# Patient Record
Sex: Female | Born: 1995 | Hispanic: Yes | Marital: Single | Smoking: Former smoker
Health system: Southern US, Community
[De-identification: ages and names within clinical notes are randomized; demographics above are authoritative.]

## PROBLEM LIST (undated history)

## (undated) DIAGNOSIS — J069 Acute upper respiratory infection, unspecified: Secondary | ICD-10-CM

## (undated) HISTORY — DX: Acute upper respiratory infection, unspecified: J06.9

---

## 2017-04-08 ENCOUNTER — Emergency Department (HOSPITAL_COMMUNITY)
Admission: EM | Admit: 2017-04-08 | Discharge: 2017-04-08 | Disposition: A | Discharge status: Home / Self Care | Payer: Self-pay | Attending: Emergency Medicine | Admitting: Emergency Medicine

## 2017-04-08 ENCOUNTER — Encounter (HOSPITAL_COMMUNITY): Payer: Self-pay | Admitting: Emergency Medicine

## 2017-04-08 DIAGNOSIS — K529 Noninfective gastroenteritis and colitis, unspecified: Secondary | ICD-10-CM | POA: Insufficient documentation

## 2017-04-08 DIAGNOSIS — F17200 Nicotine dependence, unspecified, uncomplicated: Secondary | ICD-10-CM | POA: Insufficient documentation

## 2017-04-08 LAB — COMPREHENSIVE METABOLIC PANEL
ALK PHOS: 37 U/L — AB (ref 38–126)
ALT: 13 U/L — ABNORMAL LOW (ref 14–54)
ANION GAP: 7 (ref 5–15)
AST: 21 U/L (ref 15–41)
Albumin: 4 g/dL (ref 3.5–5.0)
BUN: 8 mg/dL (ref 6–20)
CALCIUM: 9.1 mg/dL (ref 8.9–10.3)
CO2: 29 mmol/L (ref 22–32)
CREATININE: 1.01 mg/dL — AB (ref 0.44–1.00)
Chloride: 105 mmol/L (ref 101–111)
GFR calc non Af Amer: >60 mL/min (ref >60)
GLUCOSE: 130 mg/dL — AB (ref 65–99)
Potassium: 3.9 mmol/L (ref 3.5–5.1)
Sodium: 141 mmol/L (ref 135–145)
TOTAL PROTEIN: 6.7 g/dL (ref 6.5–8.1)
Total Bilirubin: 0.9 mg/dL (ref 0.3–1.2)

## 2017-04-08 LAB — URINALYSIS, ROUTINE W REFLEX MICROSCOPIC
BILIRUBIN URINE: NEGATIVE
Glucose, UA: NEGATIVE mg/dL
HGB URINE DIPSTICK: NEGATIVE
KETONES UR: NEGATIVE mg/dL
Leukocytes, UA: NEGATIVE
NITRITE: NEGATIVE
PROTEIN: NEGATIVE mg/dL
SPECIFIC GRAVITY, URINE: 1.009 (ref 1.005–1.030)
pH: 7 (ref 5.0–8.0)

## 2017-04-08 LAB — CBC
HCT: 37.6 % (ref 36.0–46.0)
HEMOGLOBIN: 13.1 g/dL (ref 12.0–15.0)
MCH: 31 pg (ref 26.0–34.0)
MCHC: 34.8 g/dL (ref 30.0–36.0)
MCV: 88.9 fL (ref 78.0–100.0)
PLATELETS: 190 10*3/uL (ref 150–400)
RBC: 4.23 MIL/uL (ref 3.87–5.11)
RDW: 12.7 % (ref 11.5–15.5)
WBC: 3.2 10*3/uL — ABNORMAL LOW (ref 4.0–10.5)

## 2017-04-08 LAB — POC URINE PREG, ED: PREG TEST UR: NEGATIVE

## 2017-04-08 LAB — LIPASE, BLOOD: LIPASE: 29 U/L (ref 11–51)

## 2017-04-08 MED ORDER — ONDANSETRON 4 MG PO TBDP
4.0000 mg | ORAL_TABLET | Freq: Once | ORAL | Status: AC | PRN
  Administered 2017-04-08: 4 mg via ORAL
  Filled 2017-04-08: qty 1

## 2017-04-08 MED ORDER — ONDANSETRON 4 MG PO TBDP: 4.0000 mg | ORAL_TABLET | Freq: Three times a day (TID) | ORAL | 0 refills | Status: DC | PRN

## 2017-04-08 MED ORDER — DIPHENOXYLATE-ATROPINE 2.5-0.025 MG PO TABS
2.0000 | ORAL_TABLET | Freq: Once | ORAL | Status: AC
  Administered 2017-04-08: 2 via ORAL
  Filled 2017-04-08: qty 2

## 2017-04-08 MED ORDER — DIPHENOXYLATE-ATROPINE 2.5-0.025 MG PO TABS: 1.0000 | ORAL_TABLET | Freq: Four times a day (QID) | ORAL | 0 refills | Status: DC | PRN

## 2017-04-08 NOTE — Discharge Instructions (Signed)
Clear liquids, push fluids.  Lomotil for diarrhea.  Zofran for nausea.

## 2017-04-08 NOTE — ED Notes (Signed)
Bed: [facility]PT4 Expected date:  Expected time:  Means of arrival:  Comments: 

## 2017-04-08 NOTE — ED Triage Notes (Signed)
Pt reports n/v/d since Friday. Has nausea today, but no emesis. Has kept fluids down today.

## 2017-04-08 NOTE — ED Provider Notes (Addendum)
[facility]-EMERGENCY DEPT Provider Note   CSN: [id number] Arrival date & time: 04/08/17  1539     History   Chief Complaint Chief Complaint  Patient presents with  . Diarrhea  . Nausea    HPI Claire Russell is a 21 y.o. female. Chief complaint is vomiting and diarrhea.  HPI 21 year old female. Today is Sunday. On Thursday night she ate at [employer]. She had diarrhea and vomiting for the last 2 days. Diarrhea has slowed but persists today. No blood pus or mucus in her stools. Last emesis was last night. Heme-negative nonbilious emesis. No abdominal pain. No fatigue. No lightheadedness or presyncope. Denies pregnancy. No urinary symptoms.  History reviewed. No pertinent past medical history.  There are no active problems to display for this patient.   History reviewed. No pertinent surgical history.  OB History    No data available       Home Medications    Prior to Admission medications   Medication Sig Start Date End Date Taking? Authorizing Provider  diphenoxylate-atropine (LOMOTIL) 2.5-0.025 MG tablet Take 1 tablet by mouth 4 (four) times daily as needed for diarrhea or loose stools. 04/08/17   Rolland Porter, MD  ondansetron (ZOFRAN ODT) 4 MG disintegrating tablet Take 1 tablet (4 mg total) by mouth every 8 (eight) hours as needed for nausea. 04/08/17   Rolland Porter, MD    Family History History reviewed. No pertinent family history.  Social History Social History  Substance Use Topics  . Smoking status: Current Every Day Smoker  . Smokeless tobacco: Never Used  . Alcohol use Yes     Allergies   Patient has no known allergies.   Review of Systems Review of Systems  Constitutional: Negative for appetite change, chills, diaphoresis, fatigue and fever.  HENT: Negative for mouth sores, sore throat and trouble swallowing.   Eyes: Negative for visual disturbance.  Respiratory: Negative for cough, chest tightness, shortness of breath and  wheezing.   Cardiovascular: Negative for chest pain.  Gastrointestinal: Positive for diarrhea, nausea and vomiting. Negative for abdominal distention and abdominal pain.  Endocrine: Negative for polydipsia, polyphagia and polyuria.  Genitourinary: Negative for dysuria, frequency and hematuria.  Musculoskeletal: Negative for gait problem.  Skin: Negative for color change, pallor and rash.  Neurological: Negative for dizziness, syncope, light-headedness and headaches.  Hematological: Does not bruise/bleed easily.  Psychiatric/Behavioral: Negative for behavioral problems and confusion.     Physical Exam Updated Vital Signs BP 133/88   Pulse 68   Temp 98.1 F (36.7 C) (Oral)   Resp 16   SpO2 100%   Physical Exam  Constitutional: She is oriented to person, place, and time. She appears well-developed and well-nourished. No distress.  HENT:  Head: Normocephalic.  Eyes: Conjunctivae are normal. Pupils are equal, round, and reactive to light. No scleral icterus.  Neck: Normal range of motion. Neck supple. No thyromegaly present.  Cardiovascular: Normal rate and regular rhythm. Exam reveals no gallop and no friction rub.  No murmur heard. Pulmonary/Chest: Effort normal and breath sounds normal. No respiratory distress. She has no wheezes. She has no rales.  Abdominal: Soft. Bowel sounds are normal. She exhibits no distension. There is no tenderness. There is no rebound.  Musculoskeletal: Normal range of motion.  Neurological: She is alert and oriented to person, place, and time.  Skin: Skin is warm and dry. No rash noted.  Psychiatric: She has a normal mood and affect. Her behavior is normal.     ED  Treatments / Results  Labs (all labs ordered are listed, but only abnormal results are displayed) Labs Reviewed  COMPREHENSIVE METABOLIC PANEL - Abnormal; Notable for the following:       Result Value   Glucose, Bld 130 (*)    Creatinine, Ser 1.01 (*)    ALT 13 (*)    Alkaline  Phosphatase 37 (*)    All other components within normal limits  CBC - Abnormal; Notable for the following:    WBC 3.2 (*)    All other components within normal limits  URINALYSIS, ROUTINE W REFLEX MICROSCOPIC - Abnormal; Notable for the following:    Color, Urine STRAW (*)    All other components within normal limits  LIPASE, BLOOD  POC URINE PREG, ED    EKG  EKG Interpretation None       Radiology No results found.  Procedures Procedures (including critical care time)  Medications Ordered in ED Medications  diphenoxylate-atropine (LOMOTIL) 2.5-0.025 MG per tablet 2 tablet (not administered)  ondansetron (ZOFRAN-ODT) disintegrating tablet 4 mg (4 mg Oral Given 04/08/17 1601)     Initial Impression / Assessment and Plan / ED Course  I have reviewed the triage vital signs and the nursing notes.  Pertinent labs & imaging results that were available during my care of the patient were reviewed by me and considered in my medical decision making (see chart for details).     Not pregnant. Reassuring labs. Does not appear dehydrated. Plan Lomotil, Zofran, fluids. Work note.  Final Clinical Impressions(s) / ED Diagnoses   Final diagnoses:  Gastroenteritis    New Prescriptions New Prescriptions   DIPHENOXYLATE-ATROPINE (LOMOTIL) 2.5-0.025 MG TABLET    Take 1 tablet by mouth 4 (four) times daily as needed for diarrhea or loose stools.   ONDANSETRON (ZOFRAN ODT) 4 MG DISINTEGRATING TABLET    Take 1 tablet (4 mg total) by mouth every 8 (eight) hours as needed for nausea.     Rolland PorterJames, Daja Shuping, MD 04/08/17 [id number]    Rolland PorterJames, Delaine Canter, MD 04/30/17 [id number]    Rolland PorterJames, Zylen Wenig, MD 06/21/17 1440

## 2017-05-25 ENCOUNTER — Encounter (HOSPITAL_COMMUNITY): Payer: Self-pay

## 2017-05-25 ENCOUNTER — Emergency Department (HOSPITAL_COMMUNITY)
Admission: EM | Admit: 2017-05-25 | Discharge: 2017-05-25 | Disposition: A | Discharge status: Home / Self Care | Payer: Self-pay | Attending: Emergency Medicine | Admitting: Emergency Medicine

## 2017-05-25 ENCOUNTER — Other Ambulatory Visit: Payer: Self-pay

## 2017-05-25 DIAGNOSIS — J029 Acute pharyngitis, unspecified: Secondary | ICD-10-CM | POA: Insufficient documentation

## 2017-05-25 DIAGNOSIS — F17210 Nicotine dependence, cigarettes, uncomplicated: Secondary | ICD-10-CM | POA: Insufficient documentation

## 2017-05-25 LAB — I-STAT BETA HCG BLOOD, ED (MC, WL, AP ONLY): I-stat hCG, quantitative: <5 m[IU]/mL (ref <5)

## 2017-05-25 LAB — URINALYSIS, ROUTINE W REFLEX MICROSCOPIC
BILIRUBIN URINE: NEGATIVE
Glucose, UA: NEGATIVE mg/dL
KETONES UR: NEGATIVE mg/dL
LEUKOCYTES UA: NEGATIVE
NITRITE: NEGATIVE
PROTEIN: 30 mg/dL — AB
Specific Gravity, Urine: 1.013 (ref 1.005–1.030)
pH: 6 (ref 5.0–8.0)

## 2017-05-25 LAB — COMPREHENSIVE METABOLIC PANEL
ALT: 22 U/L (ref 14–54)
ANION GAP: 9 (ref 5–15)
AST: 24 U/L (ref 15–41)
Albumin: 4.3 g/dL (ref 3.5–5.0)
Alkaline Phosphatase: 48 U/L (ref 38–126)
BUN: 7 mg/dL (ref 6–20)
CHLORIDE: 102 mmol/L (ref 101–111)
CO2: 27 mmol/L (ref 22–32)
Calcium: 9 mg/dL (ref 8.9–10.3)
Creatinine, Ser: 0.9 mg/dL (ref 0.44–1.00)
GFR calc Af Amer: >60 mL/min (ref >60)
Glucose, Bld: 94 mg/dL (ref 65–99)
POTASSIUM: 3.9 mmol/L (ref 3.5–5.1)
Sodium: 138 mmol/L (ref 135–145)
Total Bilirubin: 0.7 mg/dL (ref 0.3–1.2)
Total Protein: 6.9 g/dL (ref 6.5–8.1)

## 2017-05-25 LAB — CBC
HEMATOCRIT: 39.1 % (ref 36.0–46.0)
HEMOGLOBIN: 13.9 g/dL (ref 12.0–15.0)
MCH: 31.2 pg (ref 26.0–34.0)
MCHC: 35.5 g/dL (ref 30.0–36.0)
MCV: 87.9 fL (ref 78.0–100.0)
Platelets: 228 10*3/uL (ref 150–400)
RBC: 4.45 MIL/uL (ref 3.87–5.11)
RDW: 12.2 % (ref 11.5–15.5)
WBC: 5.2 10*3/uL (ref 4.0–10.5)

## 2017-05-25 LAB — RAPID STREP SCREEN (MED CTR MEBANE ONLY): STREPTOCOCCUS, GROUP A SCREEN (DIRECT): NEGATIVE

## 2017-05-25 LAB — LIPASE, BLOOD: LIPASE: 27 U/L (ref 11–51)

## 2017-05-25 MED ORDER — IBUPROFEN 400 MG PO TABS
400.0000 mg | ORAL_TABLET | Freq: Once | ORAL | Status: AC | PRN
  Administered 2017-05-25: 400 mg via ORAL
  Filled 2017-05-25: qty 1

## 2017-05-25 MED ORDER — LIDOCAINE VISCOUS 2 % MT SOLN
15.0000 mL | Freq: Once | OROMUCOSAL | Status: AC
  Administered 2017-05-25: 15 mL via OROMUCOSAL
  Filled 2017-05-25: qty 15

## 2017-05-25 MED ORDER — LIDOCAINE VISCOUS 2 % MT SOLN: 20.0000 mL | OROMUCOSAL | 1 refills | Status: DC | PRN

## 2017-05-25 MED ORDER — KETOROLAC TROMETHAMINE 15 MG/ML IJ SOLN
15.0000 mg | Freq: Once | INTRAMUSCULAR | Status: AC
  Administered 2017-05-25: 15 mg via INTRAMUSCULAR
  Filled 2017-05-25: qty 1

## 2017-05-25 MED ORDER — DEXAMETHASONE SODIUM PHOSPHATE 10 MG/ML IJ SOLN
10.0000 mg | Freq: Once | INTRAMUSCULAR | Status: AC
  Administered 2017-05-25: 10 mg via INTRAMUSCULAR
  Filled 2017-05-25: qty 1

## 2017-05-25 NOTE — ED Notes (Signed)
No Signature pad available

## 2017-05-25 NOTE — ED Triage Notes (Signed)
Per PT, Pt is coming from work with complaints of sore throat, nausea, vomiting, and diarrhea since Tuesday. Complains of swollen lymph nodes.

## 2017-05-25 NOTE — ED Provider Notes (Signed)
[facility] Provider Note   CSN: [id number] Arrival date & time: 05/25/17  [id number]  History   Chief Complaint Chief Complaint  Patient presents with  . Sore Throat  . Emesis   HPI Claire Russell is a 21 y.o. female who presented to the ED with a sore throat, N/V, and diarrhea since 12/4. She states that she believes she has strep throat. She was around some friends on 12/3 who had similar symptoms. Her throat pain has limited her PO intake and she feels she is getting dehydrate. She take ibuprofen with relief of her symptoms but then they return. She has taken some of her friends antibiotics for 1 day. Her symptoms have required her to leave work for the past 2 days and prompted her to seek further evaluation. She denies cough, fever/chills, choking sensation, hematemesis, bloody stools, SOB, CP.   History reviewed. No pertinent past medical history.  There are no active problems to display for this patient.  History reviewed. No pertinent surgical history.  OB History    No data available     Home Medications    Prior to Admission medications   Medication Sig Start Date End Date Taking? Authorizing Provider  diphenoxylate-atropine (LOMOTIL) 2.5-0.025 MG tablet Take 1 tablet by mouth 4 (four) times daily as needed for diarrhea or loose stools. 04/08/17   Rolland PorterJames, Mark, MD  lidocaine (XYLOCAINE) 2 % solution Use as directed 20 mLs in the mouth or throat as needed for mouth pain. 05/25/17   Zacharey Jensen, Jill AlexandersJustin, MD  ondansetron (ZOFRAN ODT) 4 MG disintegrating tablet Take 1 tablet (4 mg total) by mouth every 8 (eight) hours as needed for nausea. 04/08/17   Rolland PorterJames, Mark, MD   Family History No family history on file.  Social History Social History   Tobacco Use  . Smoking status: Current Every Day Smoker    Packs/day: 0.50    Types: Cigarettes  . Smokeless tobacco: Never Used  Substance Use Topics  . Alcohol use: Yes  . Drug use: No    Allergies   Patient has no known allergies.  Review of Systems  All systems reviewed and are negative for acute change except as noted in the HPI.  Physical Exam Updated Vital Signs BP (!) 140/100 (BP Location: Right Arm)   Pulse 69   Temp 97.9 F (36.6 C) (Oral)   Resp 14   Ht 5\' 3"  (1.6 m)   Wt 68 kg (150 lb)   LMP 05/18/2017   SpO2 100%   BMI 26.57 kg/m   General: Thin female in no acute distress HENT: Palpable submandibular LAD with no anterior of posterior auricular LAD. Oropharynx is erythematous but without exudates. Moist mucus membranes. Conjunctivitis bilaterally without purulence. Uluva midline without deviation   Pulm: Good air movement without wheezing or crackles. No stridor  CV: RRR, no murmurs, no rubs Abdomen: Active bowel sounds, soft, non-distended, no tenderness to palpation  Extremities: No LE edema, pulses palpable  Skin: Warm and dry. No rashes noted Neuro: Alert and oriented x3  ED Treatments / Results  Labs (all labs ordered are listed, but only abnormal results are displayed) Labs Reviewed  URINALYSIS, ROUTINE W REFLEX MICROSCOPIC - Abnormal; Notable for the following components:      Result Value   APPearance HAZY (*)    Hgb urine dipstick SMALL (*)    Protein, ur 30 (*)    Bacteria, UA MANY (*)    Squamous Epithelial / LPF 0-5 (*)  All other components within normal limits  RAPID STREP SCREEN (NOT AT [facility])  CULTURE, GROUP A STREP ([facility])  LIPASE, BLOOD  COMPREHENSIVE METABOLIC PANEL  CBC  I-STAT BETA HCG BLOOD, ED ([facility], [facility], [facility] ONLY)   EKG  EKG Interpretation None      Radiology No results found.  Procedures Procedures (including critical care time)  Medications Ordered in ED Medications  ibuprofen (ADVIL,MOTRIN) tablet 400 mg (400 mg Oral Given 05/25/17 0914)  dexamethasone (DECADRON) injection 10 mg (10 mg Intramuscular Given 05/25/17 1239)  ketorolac (TORADOL) 15 MG/ML injection 15 mg (15 mg Intramuscular Given 05/25/17  1240)  lidocaine (XYLOCAINE) 2 % viscous mouth solution 15 mL (15 mLs Mouth/Throat Given 05/25/17 1240)   Initial Impression / Assessment and Plan / ED Course  I have reviewed the triage vital signs and the nursing notes.  Pertinent labs & imaging results that were available during my care of the patient were reviewed by me and considered in my medical decision making (see chart for details).    Patient presented with sore throat, N/V, and diarrhea of 3 days duration consistent with viral pharyngitis. Her rapid strep test was negative. She likely is suffering from viral pharyngitis. She does not have any findings concerning for mono, peritonsillar abscess, Ludwig's angina, scarlet fever, or Lemierre's syndrome. She was treated acutely with decadron, Toradol, and viscous lidocaine. She experienced significant improvement in her symptoms.   She was encouraged to establish with a PCP and given a prescription for viscous lidocaine and standard return precautions. Patient voiced understanding and agreed with the treatment plan. She was discharged home in stable condition.   Final Clinical Impressions(s) / ED Diagnoses   Final diagnoses:  Sore throat   ED Discharge Orders        Ordered    lidocaine (XYLOCAINE) 2 % solution  As needed     05/25/17 1309       Levora DredgeHelberg, Tea Collums, MD 05/25/17 1319    Gerhard MunchLockwood, Robert, MD 05/27/17 [phone]

## 2017-05-27 LAB — CULTURE, GROUP A STREP (THRC)

## 2018-12-11 ENCOUNTER — Ambulatory Visit (HOSPITAL_COMMUNITY)
Admission: EM | Admit: 2018-12-11 | Discharge: 2018-12-11 | Disposition: A | Discharge status: Home / Self Care | Payer: Self-pay | Attending: Family Medicine | Admitting: Family Medicine

## 2018-12-11 ENCOUNTER — Encounter (HOSPITAL_COMMUNITY): Payer: Self-pay | Admitting: Emergency Medicine

## 2018-12-11 DIAGNOSIS — H60331 Swimmer's ear, right ear: Secondary | ICD-10-CM

## 2018-12-11 MED ORDER — NEOMYCIN-POLYMYXIN-HC 3.5-10000-1 OT SUSP: 4.0000 [drp] | Freq: Three times a day (TID) | OTIC | 0 refills | Status: DC

## 2018-12-11 NOTE — ED Triage Notes (Signed)
Pt states she has ear ache in her right ear. X 2 days

## 2018-12-12 NOTE — ED Provider Notes (Signed)
[facility]   [id number] 12/11/18 Arrival Time: 1723  ASSESSMENT & PLAN:  1. Acute swimmer's ear of right side    Meds ordered this encounter  Medications  . neomycin-polymyxin-hydrocortisone (CORTISPORIN) 3.5-10000-1 OTIC suspension    Sig: Place 4 drops into the right ear 3 (three) times daily.    Dispense:  10 mL    Refill:  0   See AVS for written guidance. Discussed typical duration of symptoms. OTC symptom care as needed. Ensure adequate fluid intake and rest. May f/u with PCP or here as needed.  Reviewed expectations re: course of current medical issues. Questions answered. Outlined signs and symptoms indicating need for more acute intervention. Patient verbalized understanding. After Visit Summary given.   SUBJECTIVE: History from: patient.  Claire BroomsKennedy Stemler is a 23 y.o. female who presents with complaint of right otalgia; without drainage; without bleeding. Onset gradual, 2-3 d ago. Recent cold symptoms: none. Fever: no. Overall normal PO intake without n/v. Sick contacts: no. OTC treatment: none reported. No significant hearing changes. No FB inserted into ears.  Social History   Tobacco Use  Smoking Status Current Every Day Smoker  . Packs/day: 0.50  . Types: Cigarettes  Smokeless Tobacco Never Used    ROS: As per HPI.   OBJECTIVE:  Vitals:   12/11/18 1756  BP: (!) 132/98  Pulse: 62  Resp: 16  Temp: 98.6 F (37 C)  SpO2: 100%     General appearance: alert; NAD2 Ear Canal: edema and inflammation on the right TM: bilateral: normal Neck: supple without LAD Lungs: unlabored respirations, symmetrical air entry; cough: absent; no respiratory distress Skin: warm and dry Psychological: alert and cooperative; normal mood and affect  No Known Allergies  PMH: occasional seasonal allergies  Social History   Socioeconomic History  . Marital status: Single    Spouse name: Not on file  . Number of children: Not on file  . Years of  education: Not on file  . Highest education level: Not on file  Occupational History  . Not on file  Social Needs  . Financial resource strain: Not on file  . Food insecurity    Worry: Not on file    Inability: Not on file  . Transportation needs    Medical: Not on file    Non-medical: Not on file  Tobacco Use  . Smoking status: Current Every Day Smoker    Packs/day: 0.50    Types: Cigarettes  . Smokeless tobacco: Never Used  Substance and Sexual Activity  . Alcohol use: Yes  . Drug use: No  . Sexual activity: Not on file  Lifestyle  . Physical activity    Days per week: Not on file    Minutes per session: Not on file  . Stress: Not on file  Relationships  . Social Musicianconnections    Talks on phone: Not on file    Gets together: Not on file    Attends religious service: Not on file    Active member of club or organization: Not on file    Attends meetings of clubs or organizations: Not on file    Relationship status: Not on file  . Intimate partner violence    Fear of current or ex partner: Not on file    Emotionally abused: Not on file    Physically abused: Not on file    Forced sexual activity: Not on file  Other Topics Concern  . Not on file  Social History Narrative  .  Not on file            Claire Kick, MD 12/12/18 [phone]

## 2019-07-11 ENCOUNTER — Ambulatory Visit (HOSPITAL_COMMUNITY)
Admission: EM | Admit: 2019-07-11 | Discharge: 2019-07-11 | Disposition: A | Discharge status: Home / Self Care | Payer: Medicaid Other | Attending: Family Medicine | Admitting: Family Medicine

## 2019-07-11 ENCOUNTER — Inpatient Hospital Stay (HOSPITAL_COMMUNITY)
Admission: AD | Admit: 2019-07-11 | Discharge: 2019-07-11 | Disposition: A | Discharge status: Home / Self Care | Payer: Medicaid Other | Attending: Family Medicine | Admitting: Family Medicine

## 2019-07-11 ENCOUNTER — Encounter (HOSPITAL_COMMUNITY): Payer: Self-pay | Admitting: Family Medicine

## 2019-07-11 ENCOUNTER — Inpatient Hospital Stay (HOSPITAL_COMMUNITY): Payer: Medicaid Other

## 2019-07-11 ENCOUNTER — Encounter (HOSPITAL_COMMUNITY): Payer: Self-pay

## 2019-07-11 ENCOUNTER — Other Ambulatory Visit: Payer: Self-pay

## 2019-07-11 DIAGNOSIS — O200 Threatened abortion: Secondary | ICD-10-CM | POA: Insufficient documentation

## 2019-07-11 DIAGNOSIS — Z87891 Personal history of nicotine dependence: Secondary | ICD-10-CM | POA: Insufficient documentation

## 2019-07-11 DIAGNOSIS — Z3A01 Less than 8 weeks gestation of pregnancy: Secondary | ICD-10-CM

## 2019-07-11 DIAGNOSIS — Z3201 Encounter for pregnancy test, result positive: Secondary | ICD-10-CM

## 2019-07-11 DIAGNOSIS — Z3A10 10 weeks gestation of pregnancy: Secondary | ICD-10-CM

## 2019-07-11 DIAGNOSIS — O26899 Other specified pregnancy related conditions, unspecified trimester: Secondary | ICD-10-CM

## 2019-07-11 DIAGNOSIS — R1030 Lower abdominal pain, unspecified: Secondary | ICD-10-CM

## 2019-07-11 DIAGNOSIS — O4691 Antepartum hemorrhage, unspecified, first trimester: Secondary | ICD-10-CM

## 2019-07-11 DIAGNOSIS — O4690 Antepartum hemorrhage, unspecified, unspecified trimester: Secondary | ICD-10-CM

## 2019-07-11 DIAGNOSIS — O26891 Other specified pregnancy related conditions, first trimester: Secondary | ICD-10-CM | POA: Insufficient documentation

## 2019-07-11 DIAGNOSIS — R109 Unspecified abdominal pain: Secondary | ICD-10-CM

## 2019-07-11 LAB — ABO/RH: ABO/RH(D): A POS

## 2019-07-11 LAB — CBC
HCT: 37.4 % (ref 36.0–46.0)
Hemoglobin: 13.3 g/dL (ref 12.0–15.0)
MCH: 32 pg (ref 26.0–34.0)
MCHC: 35.6 g/dL (ref 30.0–36.0)
MCV: 89.9 fL (ref 80.0–100.0)
Platelets: 232 10*3/uL (ref 150–400)
RBC: 4.16 MIL/uL (ref 3.87–5.11)
RDW: 12.1 % (ref 11.5–15.5)
WBC: 4.8 10*3/uL (ref 4.0–10.5)
nRBC: 0 % (ref 0.0–0.2)

## 2019-07-11 LAB — POCT URINALYSIS DIP (DEVICE)
Bilirubin Urine: NEGATIVE
Glucose, UA: NEGATIVE mg/dL
Hgb urine dipstick: NEGATIVE
Ketones, ur: NEGATIVE mg/dL
Leukocytes,Ua: NEGATIVE
Nitrite: NEGATIVE
Protein, ur: NEGATIVE mg/dL
Specific Gravity, Urine: 1.02 (ref 1.005–1.030)
Urobilinogen, UA: 0.2 mg/dL (ref 0.0–1.0)
pH: 7 (ref 5.0–8.0)

## 2019-07-11 LAB — POCT PREGNANCY, URINE: Preg Test, Ur: POSITIVE — AB

## 2019-07-11 LAB — POC URINE PREG, ED: Preg Test, Ur: POSITIVE — AB

## 2019-07-11 LAB — HCG, QUANTITATIVE, PREGNANCY: hCG, Beta Chain, Quant, S: 50553 m[IU]/mL — ABNORMAL HIGH (ref <5)

## 2019-07-11 MED ORDER — PRENATAL ADULT GUMMY/DHA/FA 0.4-25 MG PO CHEW: 1.0000 | CHEWABLE_TABLET | Freq: Every day | ORAL | 0 refills | Status: DC

## 2019-07-11 NOTE — MAU Note (Signed)
Been having some abd pain for about a month.  Bleeding off and of for the past 2 month, not like a normal period, spotting and little clots. Last period was Nov.  Pos preg test at Urgent Care, did pelvic and cultures.

## 2019-07-11 NOTE — Discharge Instructions (Signed)
Please go to MAU for further evaluation of abdominal pain with positive pregnancy

## 2019-07-11 NOTE — MAU Note (Signed)
Pt discharged home, left before signing and vital signs taken again

## 2019-07-11 NOTE — MAU Provider Note (Signed)
History     CSN: [id number]  Arrival date and time: 07/11/19 1522   First Provider Initiated Contact with Patient 07/11/19 1713      Chief Complaint  Patient presents with  . Vaginal Bleeding   Claire Russell is a 24 y.o. G4P0 at [redacted]w[redacted]d by LMP who presents to MAU with complaints of abdominal pain and vaginal bleeding. She was sent over the MAU from urgent care primarily for ultrasound. Patient reports having full examination including pelvic examination at [facility].   Patient reports she has been abdominal pain for 1 month and bleeding for about 2 months. She describes the abdominal pain as lower abdominal cramping, that is intermittent. She rates pain 2/10 - has not taken any medication for abdominal pain. She describes the vaginal bleeding as clots and spotting.     OB History    Gravida  4   Para  0   Term  0   Preterm  0   AB  3   Living  0     SAB  0   TAB  3   Ectopic  0   Multiple  0   Live Births  0           History reviewed. No pertinent past medical history.  History reviewed. No pertinent surgical history.  Family History  Problem Relation Age of Onset  . Hypertension Mother   . Healthy Father     Social History   Tobacco Use  . Smoking status: Former Smoker    Packs/day: 0.50    Types: Cigarettes  . Smokeless tobacco: Never Used  Substance Use Topics  . Alcohol use: Yes  . Drug use: No    Allergies: No Known Allergies  Medications Prior to Admission  Medication Sig Dispense Refill Last Dose  . Prenatal MV & Min w/FA-DHA (PRENATAL ADULT GUMMY/DHA/FA) 0.4-25 MG CHEW Chew 1 each by mouth daily. 30 tablet 0 not started yet    Review of Systems  Constitutional: Negative.   Respiratory: Negative.   Cardiovascular: Negative.   Gastrointestinal: Positive for abdominal pain. Negative for constipation, diarrhea, nausea and vomiting.  Genitourinary: Positive for vaginal bleeding. Negative for difficulty urinating, dysuria, frequency,  pelvic pain and urgency.  Musculoskeletal: Negative.   Neurological: Negative.    Physical Exam   Blood pressure 139/89, pulse 76, temperature 98.3 F (36.8 C), temperature source Oral, resp. rate 16, height 5\' 3"  (1.6 m), weight 69.1 kg, last menstrual period 05/02/2019, SpO2 100 %.  Physical Exam  Nursing note and vitals reviewed. Constitutional: She is oriented to person, place, and time. She appears well-developed and well-nourished. No distress.  Cardiovascular: Normal rate and regular rhythm.  Respiratory: Effort normal and breath sounds normal. No respiratory distress. She has no wheezes.  GI: Soft. There is no abdominal tenderness. There is no rebound and no guarding.  Genitourinary:    Genitourinary Comments: Pelvic examination deferred d/t recent exam at UC   Musculoskeletal:        General: No edema. Normal range of motion.  Neurological: She is alert and oriented to person, place, and time.  Psychiatric: She has a normal mood and affect. Her behavior is normal. Thought content normal.    MAU Course  Procedures  MDM Orders Placed This Encounter  Procedures  . US OB LESS THAN 14 WEEKS WITH OB TRANSVAGINAL  . US OB Transvaginal  . CBC  . hCG, quantitative, pregnancy  . ABO/Rh   Labs and [place] report reviewed:  Results for orders placed or performed during the hospital encounter of 07/11/19 (from the past 24 hour(s))  CBC     Status: None   Collection Time: 07/11/19  3:57 PM  Result Value Ref Range   WBC 4.8 4.0 - 10.5 K/uL   RBC 4.16 3.87 - 5.11 MIL/uL   Hemoglobin 13.3 12.0 - 15.0 g/dL   HCT 44.0 34.7 - 42.5 %   MCV 89.9 80.0 - 100.0 fL   MCH 32.0 26.0 - 34.0 pg   MCHC 35.6 30.0 - 36.0 g/dL   RDW 95.6 38.7 - 56.4 %   Platelets 232 150 - 400 K/uL   nRBC 0.0 0.0 - 0.2 %  ABO/Rh     Status: None   Collection Time: 07/11/19  3:57 PM  Result Value Ref Range   ABO/RH(D) A POS    No rh immune globuloin      NOT A RH IMMUNE GLOBULIN CANDIDATE, PT RH  POSITIVE Performed at [facility], [address] [place], [place] [address]   hCG, quantitative, pregnancy     Status: Abnormal   Collection Time: 07/11/19  3:57 PM  Result Value Ref Range   hCG, Beta Chain, Quant, S 50,553 (H) <5 mIU/mL   US OB LESS THAN 14 WEEKS WITH OB TRANSVAGINAL  Result Date: 07/11/2019 CLINICAL DATA:  Vaginal bleeding during pregnancy. EXAM: OBSTETRIC <14 WK [place] AND TRANSVAGINAL OB US TECHNIQUE: Both transabdominal and transvaginal ultrasound examinations were performed for complete evaluation of the gestation as well as the maternal uterus, adnexal regions, and pelvic cul-de-sac. Transvaginal technique was performed to assess early pregnancy. COMPARISON:  None. FINDINGS: Intrauterine gestational sac: Single, present in lower uterine segment. Yolk sac:  Visualized. Embryo:  Visualized. Cardiac Activity: Not Visualized CRL: 6.13 mm 6 w 3 d [place] [facility]: March 02, 2020. Subchorionic hemorrhage:  None visualized. Maternal uterus/adnexae: Ovaries are unremarkable. No free fluid is noted. IMPRESSION: Fetal pole is noted with crown-rump length corresponding to age of 6 weeks 3 days. No fetal heart rate is noted, and the gestational sac is in the lower uterine segment. Findings are suspicious but not definitive for failed pregnancy. Recommend follow-up US in 10-14 days for definitive diagnosis. This recommendation follows SRU consensus guidelines: Diagnostic Criteria for Nonviable Pregnancy Early in the First Trimester. Malva Limes Med 2013; 188:4166-06. Electronically Signed   By: Lupita Raider M.D.   On: 07/11/2019 16:44   Discussed results of [place] and labs with patient. Discussed importance of f/u US in 10-14 days for viability. Discussed finding of [place] with GS in lower uterine segment- miscarriage precautions. Discussed warning signs and reasons to return to MAU. Pt stable at time of discharge.   Assessment and Plan   1. Threatened miscarriage in early pregnancy   2. Abdominal  pain during pregnancy   3. Vaginal bleeding during pregnancy   4. [redacted] weeks gestation of pregnancy    Discharge home Follow up as scheduled for viability [place]  Return to MAU as needed for reasons discussed and/or emergencies  Miscarriage precautions   Follow-up Information    [facility] Follow up.   Specialty: Obstetrics and Gynecology Why: Return to MAU as needed for reasons discussed  Contact information: [id number] [place] [address] [phone]         Allergies as of 07/11/2019   No Known Allergies     Medication List    TAKE these medications   Prenatal  Adult Gummy/DHA/FA 0.4-25 MG Chew Chew 1 each by mouth daily.       Sharyon Cable CNM 07/11/2019, 5:27 PM

## 2019-07-11 NOTE — ED Triage Notes (Signed)
Patient presents to Urgent Care with complaints of lower abdominal pain and changes in her vaginal discharge since a month ago. Patient reports there is a chance she could be pregnant, LMP 06/01/2019.

## 2019-07-11 NOTE — ED Provider Notes (Signed)
[facility]    CSN: [id number] Arrival date & time: 07/11/19  1419      History   Chief Complaint Chief Complaint  Patient presents with  . Abdominal Pain    HPI Claire Russell is a 24 y.o. female no significant past medical history presenting today for evaluation of abdominal pain and abnormal bleeding.  Patient states that over the past few weeks she has had lower abdominal cramping.  She has also noticed abnormal bleeding and passing of clots even when she is not on her menstrual cycle.  States it is normal to have irregular cycles and believes her last cycle was in December.  She states that this cramping is more than normal.  Denies any abnormal discharge other than clot/bloody discharge.  She is not on any form of birth control.  Denies any urinary symptoms of dysuria, increased frequency or urgency.  Patient reports 4 prior abortions.  HPI  History reviewed. No pertinent past medical history.  There are no problems to display for this patient.   History reviewed. No pertinent surgical history.  OB History    Gravida  1   Para      Term      Preterm      AB      Living        SAB      TAB      Ectopic      Multiple      Live Births               Home Medications    Prior to Admission medications   Medication Sig Start Date End Date Taking? Authorizing Provider  Prenatal MV & Min w/FA-DHA (PRENATAL ADULT GUMMY/DHA/FA) 0.4-25 MG CHEW Chew 1 each by mouth daily. 07/11/19   Carla Whilden, Junius Creamer, PA-C    Family History Family History  Problem Relation Age of Onset  . Hypertension Mother   . Healthy Father     Social History Social History   Tobacco Use  . Smoking status: Former Smoker    Packs/day: 0.50    Types: Cigarettes  . Smokeless tobacco: Never Used  Substance Use Topics  . Alcohol use: Yes  . Drug use: No     Allergies   Patient has no known allergies.   Review of Systems Review of Systems  Constitutional:  Negative for fever.  Respiratory: Negative for shortness of breath.   Cardiovascular: Negative for chest pain.  Gastrointestinal: Negative for abdominal pain, diarrhea, nausea and vomiting.  Genitourinary: Positive for pelvic pain. Negative for dysuria, flank pain, genital sores, hematuria, menstrual problem, vaginal bleeding, vaginal discharge and vaginal pain.  Musculoskeletal: Negative for back pain.  Skin: Negative for rash.  Neurological: Negative for dizziness, light-headedness and headaches.     Physical Exam Triage Vital Signs ED Triage Vitals  Enc Vitals Group     BP 07/11/19 1429 (!) 136/91     Pulse Rate 07/11/19 1429 71     Resp 07/11/19 1429 16     Temp 07/11/19 1429 98.4 F (36.9 C)     Temp Source 07/11/19 1429 Oral     SpO2 07/11/19 1429 99 %     Weight --      Height --      Head Circumference --      Peak Flow --      Pain Score 07/11/19 1428 4     Pain Loc --      Pain  Edu? --      Excl. in [place]? --    No data found.  Updated Vital Signs BP (!) 136/91 (BP Location: Right Arm)   Pulse 71   Temp 98.4 F (36.9 C) (Oral)   Resp 16   LMP 11/18/2018   SpO2 99%   Visual Acuity Right Eye Distance:   Left Eye Distance:   Bilateral Distance:    Right Eye Near:   Left Eye Near:    Bilateral Near:     Physical Exam Vitals and nursing note reviewed.  Constitutional:      General: She is not in acute distress.    Appearance: She is well-developed.  HENT:     Head: Normocephalic and atraumatic.  Eyes:     Conjunctiva/sclera: Conjunctivae normal.  Cardiovascular:     Rate and Rhythm: Normal rate and regular rhythm.     Heart sounds: No murmur.  Pulmonary:     Effort: Pulmonary effort is normal. No respiratory distress.     Breath sounds: Normal breath sounds.  Abdominal:     Palpations: Abdomen is soft.     Tenderness: There is abdominal tenderness.     Comments: Soft, nondistended, tenderness to palpation to mid lower abdomen and suprapubic area    Genitourinary:    Comments: Normal external female genitalia, vaginal mucosa pink, small amount of white discharge present, thick mucous plug present within cervical os, no cervical erythema, no adnexal masses palpated Musculoskeletal:     Cervical back: Neck supple.  Skin:    General: Skin is warm and dry.  Neurological:     Mental Status: She is alert.      UC Treatments / Results  Labs (all labs ordered are listed, but only abnormal results are displayed) Labs Reviewed  POC URINE PREG, ED - Abnormal; Notable for the following components:      Result Value   Preg Test, Ur POSITIVE (*)    All other components within normal limits  POCT PREGNANCY, URINE - Abnormal; Notable for the following components:   Preg Test, Ur POSITIVE (*)    All other components within normal limits  POCT URINALYSIS DIP (DEVICE)  CERVICOVAGINAL ANCILLARY ONLY    EKG   Radiology No results found.  Procedures Procedures (including critical care time)  Medications Ordered in UC Medications - No data to display  Initial Impression / Assessment and Plan / UC Course  I have reviewed the triage vital signs and the nursing notes.  Pertinent labs & imaging results that were available during my care of the patient were reviewed by me and considered in my medical decision making (see chart for details).     Pregnancy test positive, UA unremarkable.  Given his history abdominal pain and reported bleeding recommending to follow-up in [facility] to rule out ectopic.  Provided prenatal, follow-up with women's outpatient otherwise.  Cervical vaginal swab pending to evaluate for any vaginal infections including STDs.  Discussed strict return precautions. Patient verbalized understanding and is agreeable with plan.  Final Clinical Impressions(s) / UC Diagnoses   Final diagnoses:  Positive pregnancy test  Lower abdominal pain     Discharge Instructions     Please go to [facility]U for further evaluation of  abdominal pain with positive pregnancy   ED Prescriptions    Medication Sig Dispense Auth. Provider   Prenatal MV & Min w/FA-DHA (PRENATAL ADULT GUMMY/DHA/FA) 0.4-25 MG CHEW Chew 1 each by mouth daily. 30 tablet Diego Delancey, [place] C, PA-C  PDMP not reviewed this encounter.   Lew Dawes, [place] 07/11/19 1544

## 2019-07-16 LAB — CERVICOVAGINAL ANCILLARY ONLY
Bacterial vaginitis: NEGATIVE
Candida vaginitis: NEGATIVE
Chlamydia: NEGATIVE
Neisseria Gonorrhea: NEGATIVE
Trichomonas: NEGATIVE

## 2019-07-21 ENCOUNTER — Telehealth (HOSPITAL_COMMUNITY): Payer: Self-pay

## 2019-07-21 MED ORDER — PRENATAL COMPLETE 14-0.4 MG PO TABS: 1.0000 | ORAL_TABLET | Freq: Every morning | ORAL | 0 refills | Status: DC

## 2019-07-21 NOTE — Telephone Encounter (Signed)
Prenatal vitamins changed from gummies to tablets so they are covered by insurance per pt request.

## 2019-07-30 ENCOUNTER — Inpatient Hospital Stay (HOSPITAL_COMMUNITY): Payer: Self-pay

## 2019-07-30 ENCOUNTER — Inpatient Hospital Stay (HOSPITAL_COMMUNITY)
Admission: AD | Admit: 2019-07-30 | Discharge: 2019-07-30 | Disposition: A | Discharge status: Home / Self Care | Payer: Self-pay | Attending: Obstetrics and Gynecology | Admitting: Obstetrics and Gynecology

## 2019-07-30 ENCOUNTER — Other Ambulatory Visit: Payer: Self-pay

## 2019-07-30 ENCOUNTER — Encounter (HOSPITAL_COMMUNITY): Payer: Self-pay | Admitting: Obstetrics and Gynecology

## 2019-07-30 DIAGNOSIS — Z87891 Personal history of nicotine dependence: Secondary | ICD-10-CM | POA: Insufficient documentation

## 2019-07-30 DIAGNOSIS — R109 Unspecified abdominal pain: Secondary | ICD-10-CM | POA: Insufficient documentation

## 2019-07-30 DIAGNOSIS — O039 Complete or unspecified spontaneous abortion without complication: Secondary | ICD-10-CM | POA: Insufficient documentation

## 2019-07-30 DIAGNOSIS — Z3A09 9 weeks gestation of pregnancy: Secondary | ICD-10-CM | POA: Insufficient documentation

## 2019-07-30 DIAGNOSIS — O209 Hemorrhage in early pregnancy, unspecified: Secondary | ICD-10-CM

## 2019-07-30 LAB — URINALYSIS, ROUTINE W REFLEX MICROSCOPIC
Bilirubin Urine: NEGATIVE
Glucose, UA: NEGATIVE mg/dL
Ketones, ur: NEGATIVE mg/dL
Leukocytes,Ua: NEGATIVE
Nitrite: NEGATIVE
Protein, ur: NEGATIVE mg/dL
Specific Gravity, Urine: 1.009 (ref 1.005–1.030)
pH: 8 (ref 5.0–8.0)

## 2019-07-30 MED ORDER — OXYCODONE HCL 5 MG PO TABS: 5.0000 mg | ORAL_TABLET | Freq: Three times a day (TID) | ORAL | 0 refills | Status: AC | PRN

## 2019-07-30 MED ORDER — MISOPROSTOL 200 MCG PO TABS: 800.0000 ug | ORAL_TABLET | ORAL | 1 refills | Status: DC | PRN

## 2019-07-30 MED ORDER — IBUPROFEN 600 MG PO TABS: 600.0000 mg | ORAL_TABLET | Freq: Four times a day (QID) | ORAL | 0 refills | Status: DC | PRN

## 2019-07-30 NOTE — [facility]U Note (Signed)
Pt has had abdominal cramping x24 hours. Saw some light spotting and one clot about 2 hours ago.  Denies LOF,

## 2019-07-30 NOTE — Discharge Instructions (Signed)
FACTS YOU SHOULD KNOW  WHAT IS AN EARLY PREGNANCY FAILURE? Once the egg is fertilized with the sperm and begins to develop, it attaches to the lining of the uterus. This early pregnancy tissue may not develop into an embryo (the beginning stage of a baby). Sometimes an embryo does develop but does not continue to grow. These problems can be seen on ultrasound.   MANAGEMNT OF EARLY PREGNANCY FAILURE: About 4 out of 100 (0.25%) women will have a pregnancy loss in her lifetime.  One in five pregnancies is found to be an early pregnancy failure.  There are 3 ways to care for an early pregnancy failure:   (1) Surgery, (2) Medicine, (3) Waiting for you to pass the pregnancy on your own. The decision as to how to proceed after being diagnosed with and early pregnancy failure is an individual one.  The decision can be made only after appropriate counseling.  You need to weigh the pros and cons of the 3 choices. Then you can make the choice that works for you. SURGERY (D&E) . Procedure over in 1 day . Requires being put to sleep . Bleeding may be light . Possible problems during surgery, including injury to womb(uterus) . Care provider has more control Medicine (CYTOTEC) . The complete procedure may take days to weeks . No Surgery . Bleeding may be heavy at times . There may be drug side effects . Patient has more control Waiting . You may choose to wait, in which case your own body may complete the passing of the abnormal early pregnancy on its own in about 2-4 weeks . Your bleeding may be heavy at times . There is a small possibility that you may need surgery if the bleeding is too much or not all of the pregnancy has passed. CYTOTEC MANAGEMENT Prostaglandins (cytotec) are the most widely used drug for this purpose. They cause the uterus to cramp and contract. You will place the medicine yourself inside your vagina in the privacy of your home. Empting of the uterus should occur within 3 days but  the process may continue for several weeks. The bleeding may seem heavy at times. POSSIBLE SIDE EFFECTS FROM CYTOTEC . Nausea   Vomiting . Diarrhea Fever . Chills  Hot Flashes Side effects  from the process of the early pregnancy failure include: . Cramping  Bleeding . Headaches  Dizziness RISKS: This is a low risk procedure. Less than 1 in 100 women has a complication. An incomplete passage of the early pregnancy may occur. Also, Hemorrhage (heavy bleeding) could happen.  Rarely the pregnancy will not be passed completely. Excessively heavy bleeding may occur.  Your doctor may need to perform surgery to empty the uterus (D&E). Afterwards: Everybody will feel differently after the early pregnancy completion. You may have soreness or cramps for a day or two. You may have soreness or cramps for day or two.  You may have light bleeding for up to 2 weeks. You may be as active as you feel like being. If you have any of the following problems you may call [facility] at [phone]. . If you have pain that does not get better  with pain medication . Bleeding that soaks through 2 thick full-sized sanitary pads in an hour . Cramps that last longer than 2 days . Foul smelling discharge . Fever above 100.4 degrees F Even if you do not have any of these symptoms, you should have a follow-up exam to make sure you   are healing properly. This appointment will be made for you before you leave the hospital. Your next normal period will start again in 4-6 week after the loss. You can get pregnant soon after the loss, so use birth control right away. Finally: Make sure all your questions are answered before during and after any procedure. Follow up with medical care and family planning methods.      Managing Pregnancy Loss Pregnancy loss can happen any time during a pregnancy. Often the cause is not known. It is rarely because of anything you did. Pregnancy loss in early pregnancy (during the  first trimester) is called a miscarriage. This type of pregnancy loss is the most common. Pregnancy loss that happens after 20 weeks of pregnancy is called fetal demise if the baby's heart stops beating before birth. Fetal demise is much less common. Some women experience spontaneous labor shortly after fetal demise resulting in a stillborn birth (stillbirth). Any pregnancy loss can be devastating. You will need to recover both physically and emotionally. Most women are able to get pregnant again after a pregnancy loss and deliver a healthy baby. How to manage emotional recovery  Pregnancy loss is very hard emotionally. You may feel many different emotions while you grieve. You may feel sad and angry. You may also feel guilty. It is normal to have periods of crying. Emotional recovery can take longer than physical recovery. It is different for everyone. Taking these steps can help you in managing this loss:  Remember that it is unlikely you did anything to cause the pregnancy loss.  Share your thoughts and feelings with friends, family, and your partner. Remember that your partner is also recovering emotionally.  Make sure you have a good support system. Do not spend too much time alone.  Meet with a pregnancy loss counselor or join a pregnancy loss support group.  Get enough sleep and eat a healthy diet. Return to regular exercise when you have recovered physically.  Do not use drugs or alcohol to manage your emotions.  Consider seeing a mental health professional to help you recover emotionally.  Ask a friend or loved one to help you decide what to do with any clothing and nursery items you received for your baby. In the case of a stillbirth, many women benefit from taking additional steps in the grieving process. You may want to:  Hold your baby after the birth.  Name your baby.  Request a birth certificate.  Create a keepsake such as handprints or footprints.  Dress your baby and  have a picture taken.  Make funeral arrangements.  Ask for a baptism or blessing. Hospitals have staff members who can help you with all these arrangements. How to recognize emotional stress It is normal to have emotional stress after a pregnancy loss. But emotional stress that lasts a long time or becomes severe requires treatment. Watch out for these signs of severe emotional stress:  Sadness, anger, or guilt that is not going away and is interfering with your normal activities.  Relationship problems that have occurred or gotten worse since the pregnancy loss.  Signs of depression that last longer than 2 weeks. These may include: ? Sadness. ? Anxiety. ? Hopelessness. ? Loss of interest in activities you enjoy. ? Inability to concentrate. ? Trouble sleeping or sleeping too much. ? Loss of appetite or overeating. ? Thoughts of death or of hurting yourself. Follow these instructions at home:  Take over-the-counter and prescription medicines only as told by   your health care provider.  Rest at home until your energy level returns. Return to your normal activities as told by your health care provider. Ask your health care provider what activities are safe for you.  When you are ready, meet with your health care provider to discuss steps to take for a future pregnancy.  Keep all follow-up visits as told by your health care provider. This is important. Where to find support  To help you and your partner with the process of grieving, talk with your health care provider or seek counseling.  Consider meeting with others who have experienced pregnancy loss. Ask your health care provider about support groups and resources. Where to find more information  [place] Department of Health and Human Services Office on Women's Health: www.womenshealth.gov  American Pregnancy Association: www.americanpregnancy.org Contact a health care provider if:  You continue to experience grief, sadness, or  lack of motivation for everyday activities, and those feelings do not improve over time.  You are struggling to recover emotionally, especially if you are using alcohol or substances to help. Get help right away if:  You have thoughts of hurting yourself or others. If you ever feel like you may hurt yourself or others, or have thoughts about taking your own life, get help right away. You can go to your nearest emergency department or call:  Your local emergency services (911 in the [place]  A suicide crisis helpline, such as the National Suicide Prevention Lifeline at 1-800-273-8255. This is open 24 hours a day. Summary  Any pregnancy loss can be difficult physically and emotionally.  You may experience many different emotions while you grieve. Emotional recovery can last longer than physical recovery.  It is normal to have emotional stress after a pregnancy loss. But emotional stress that lasts a long time or becomes severe requires treatment.  See your health care provider if you are struggling emotionally after a pregnancy loss. This information is not intended to replace advice given to you by your health care provider. Make sure you discuss any questions you have with your health care provider. Document Revised: 09/25/2018 Document Reviewed: 08/16/2017 [employer] Patient Education  2020 [employer]  

## 2019-07-30 NOTE — MAU Provider Note (Signed)
Chief Complaint: Abdominal Cramping   First Provider Initiated Contact with Patient 07/30/19 1512     SUBJECTIVE HPI: Claire Russell is a 24 y.o. G4P0030 at [redacted]w[redacted]d who presents to Maternity Admissions reporting abdominal cramping & vaginal bleeding. Has had abdominal cramping for the last day. Has had intermittent bleeding for the last month that has increased since last night. No saturating pads but blood is now bright red & passed a clot earlier today. Has not treated abdominal pain. Had an ultrasound in MAU a few weeks ago that was concerning for impending miscarriage d/t IUP in lower uterine segment.   Location: abdomen Quality: cramping Severity: 10/10 on pain scale Duration: 1 day Timing: intermittent Modifying factors: nothing makes worse. Hasn't treated symptoms Associated signs and symptoms: vaginal bleeding  History reviewed. No pertinent past medical history. OB History  Gravida Para Term Preterm AB Living  4 0 0 0 3 0  SAB TAB Ectopic Multiple Live Births  0 3 0 0 0    # Outcome Date GA Lbr Len/2nd Weight Sex Delivery Anes PTL Lv  4 Current           3 TAB           2 TAB           1 TAB            History reviewed. No pertinent surgical history. Social History   Socioeconomic History  . Marital status: Single    Spouse name: Not on file  . Number of children: Not on file  . Years of education: Not on file  . Highest education level: Not on file  Occupational History  . Not on file  Tobacco Use  . Smoking status: Former Smoker    Packs/day: 0.50    Types: Cigarettes  . Smokeless tobacco: Never Used  Substance and Sexual Activity  . Alcohol use: Yes  . Drug use: No  . Sexual activity: Not on file  Other Topics Concern  . Not on file  Social History Narrative  . Not on file   Social Determinants of Health   Financial Resource Strain:   . Difficulty of Paying Living Expenses: Not on file  Food Insecurity:   . Worried About Charity fundraiser in the  Last Year: Not on file  . Ran Out of Food in the Last Year: Not on file  Transportation Needs:   . Lack of Transportation (Medical): Not on file  . Lack of Transportation (Non-Medical): Not on file  Physical Activity:   . Days of Exercise per Week: Not on file  . Minutes of Exercise per Session: Not on file  Stress:   . Feeling of Stress : Not on file  Social Connections:   . Frequency of Communication with Friends and Family: Not on file  . Frequency of Social Gatherings with Friends and Family: Not on file  . Attends Religious Services: Not on file  . Active Member of Clubs or Organizations: Not on file  . Attends Archivist Meetings: Not on file  . Marital Status: Not on file  Intimate Partner Violence:   . Fear of Current or Ex-Partner: Not on file  . Emotionally Abused: Not on file  . Physically Abused: Not on file  . Sexually Abused: Not on file   Family History  Problem Relation Age of Onset  . Hypertension Mother   . Healthy Father    No current facility-administered medications on file prior  to encounter.   Current Outpatient Medications on File Prior to Encounter  Medication Sig Dispense Refill  . Prenatal MV & Min w/FA-DHA (PRENATAL ADULT GUMMY/DHA/FA) 0.4-25 MG CHEW Chew 1 each by mouth daily. 30 tablet 0  . Prenatal Vit-Fe Fumarate-FA (PRENATAL COMPLETE) 14-0.4 MG TABS Take 1 tablet by mouth every morning. 60 tablet 0   No Known Allergies  I have reviewed patient's Past Medical Hx, Surgical Hx, Family Hx, Social Hx, medications and allergies.   Review of Systems  Constitutional: Negative.   Gastrointestinal: Positive for abdominal pain. Negative for constipation, diarrhea, nausea and vomiting.  Genitourinary: Positive for vaginal bleeding. Negative for dysuria.    OBJECTIVE Patient Vitals for the past 24 hrs:  BP Temp Temp src Pulse Resp SpO2 Height Weight  07/30/19 1706 (!) 144/83 -- -- 72 16 -- -- --  07/30/19 1505 (!) 125/95 -- -- 69 -- -- --  --  07/30/19 1413 (!) 124/93 98.5 F (36.9 C) Oral 63 18 99 % 5\' 3"  (1.6 m) 70.1 kg   Constitutional: Well-developed, well-nourished female in no acute distress.  Cardiovascular: normal rate & rhythm, no murmur Respiratory: normal rate and effort. Lung sounds clear throughout GI: Abd soft, non-tender, Pos BS x 4. No guarding or rebound tenderness MS: Extremities nontender, no edema, normal ROM Neurologic: Alert and oriented x 4.      LAB RESULTS Results for orders placed or performed during the hospital encounter of 07/30/19 (from the past 24 hour(s))  Urinalysis, Routine w reflex microscopic     Status: Abnormal   Collection Time: 07/30/19  2:54 PM  Result Value Ref Range   Color, Urine STRAW (A) YELLOW   APPearance CLEAR CLEAR   Specific Gravity, Urine 1.009 1.005 - 1.030   pH 8.0 5.0 - 8.0   Glucose, UA NEGATIVE NEGATIVE mg/dL   Hgb urine dipstick SMALL (A) NEGATIVE   Bilirubin Urine NEGATIVE NEGATIVE   Ketones, ur NEGATIVE NEGATIVE mg/dL   Protein, ur NEGATIVE NEGATIVE mg/dL   Nitrite NEGATIVE NEGATIVE   Leukocytes,Ua NEGATIVE NEGATIVE   RBC / HPF 0-5 0 - 5 RBC/hpf   WBC, UA 0-5 0 - 5 WBC/hpf   Bacteria, UA RARE (A) NONE SEEN   Squamous Epithelial / LPF 0-5 0 - 5    IMAGING 09/27/19 OB Comp Less 14 Wks  Result Date: 07/30/2019 CLINICAL DATA:  Vaginal bleeding, quantitative beta HCG 50,553, 09/27/2019 EXAM: OBSTETRIC <14 WK ULTRASOUND TECHNIQUE: Transabdominal ultrasound was performed for evaluation of the gestation as well as the maternal uterus and adnexal regions. COMPARISON:  07/11/2019 FINDINGS: Intrauterine gestational sac: None Yolk sac:  Not Visualized. Embryo:  Not Visualized. Maternal uterus/adnexae: The uterus is anteverted. There is complex echogenic material within the endometrial cavity extending into the endocervical canal, likely representing spontaneous abortion. The right ovary measures 3.7 x 3.5 by 2.6 cm and the left ovary measures 4.2 by 1.8 x 1.9 cm. IMPRESSION: 1.  Complex echogenic material within the endometrial cavity and endocervical canal, consistent with abortion in progress. The gestational sac and fetal pole seen previously are no longer visualized. Electronically Signed   By: 07/13/2019 M.D.   On: 07/30/2019 16:21    MAU COURSE Orders Placed This Encounter  Procedures  . 09/27/2019 OB Comp Less 14 Wks  . Urinalysis, Routine w reflex microscopic  . Discharge patient   Meds ordered this encounter  Medications  . misoprostol (CYTOTEC) 200 MCG tablet    Sig: Take 4 tablets (800 mcg total) by  mouth every other day as needed.    Dispense:  4 tablet    Refill:  1    Order Specific Question:   Supervising Provider    Answer:   Conan Bowens [[id number]]  . oxyCODONE (ROXICODONE) 5 MG immediate release tablet    Sig: Take 1 tablet (5 mg total) by mouth every 8 (eight) hours as needed for up to 5 days for breakthrough pain.    Dispense:  15 tablet    Refill:  0    Order Specific Question:   Supervising Provider    Answer:   Conan Bowens [[id number]]  . ibuprofen (ADVIL) 600 MG tablet    Sig: Take 1 tablet (600 mg total) by mouth every 6 (six) hours as needed for cramping.    Dispense:  20 tablet    Refill:  0    Order Specific Question:   Supervising Provider    Answer:   Conan Bowens [[id number]]    MDM RH positive Ultrasound previously showed IUP without cardiac activity in lower uterine segment; today IUP is no longer seen, complex echogenic material consistent with SAB in progress.  Discussed results with patient. Discussed expectant management vs cytotec. She would like rx for cytotec.   ASSESSMENT 1. Miscarriage   2. Vaginal bleeding in pregnancy, first trimester     PLAN Discharge home in stable condition. Bleeding & infection precautions Rx cycotec & oxycodone. Pt to use tylenol & ibuprofen prn Message to [facility] for miscarriage f/u  Follow-up Information    [facility] Follow up.   Specialty: Obstetrics  and Gynecology Why: the office will call you to schedule a follow up appointment Contact information: [address] [place] [address] [phone]       [facility] Follow up.   Specialty: Obstetrics and Gynecology Why: return for worsening symptoms Contact information: [address] [phone] [place] [address] [phone]         Allergies as of 07/30/2019   No Known Allergies     Medication List    TAKE these medications   ibuprofen 600 MG tablet Commonly known as: ADVIL Take 1 tablet (600 mg total) by mouth every 6 (six) hours as needed for cramping.   misoprostol 200 MCG tablet Commonly known as: CYTOTEC Take 4 tablets (800 mcg total) by mouth every other day as needed.   oxyCODONE 5 MG immediate release tablet Commonly known as: Roxicodone Take 1 tablet (5 mg total) by mouth every 8 (eight) hours as needed for up to 5 days for breakthrough pain.   Prenatal Adult Gummy/DHA/FA 0.4-25 MG Chew Chew 1 each by mouth daily.   Prenatal Complete 14-0.4 MG Tabs Take 1 tablet by mouth every morning.        Judeth Horn, NP 07/30/2019  7:24 PM

## 2019-07-31 ENCOUNTER — Telehealth: Payer: Self-pay | Admitting: General Practice

## 2019-07-31 NOTE — Telephone Encounter (Signed)
Pt aware of appt on 08/06/2019 at 2:10pm for SAB and verbalized understanding.

## 2019-08-06 ENCOUNTER — Ambulatory Visit: Payer: Medicaid Other | Admitting: Certified Nurse Midwife

## 2019-10-22 ENCOUNTER — Ambulatory Visit: Payer: Medicaid Other | Admitting: Obstetrics and Gynecology

## 2019-10-22 DIAGNOSIS — Z0289 Encounter for other administrative examinations: Secondary | ICD-10-CM

## 2019-10-27 ENCOUNTER — Other Ambulatory Visit: Payer: Medicaid Other

## 2019-10-29 ENCOUNTER — Ambulatory Visit: Payer: Medicaid Other | Attending: Internal Medicine

## 2019-10-29 DIAGNOSIS — Z20822 Contact with and (suspected) exposure to covid-19: Secondary | ICD-10-CM

## 2019-10-30 LAB — NOVEL CORONAVIRUS, NAA: SARS-CoV-2, NAA: NOT DETECTED

## 2019-10-30 LAB — SARS-COV-2, NAA 2 DAY TAT

## 2020-06-29 ENCOUNTER — Other Ambulatory Visit: Payer: Medicaid Other

## 2020-06-29 DIAGNOSIS — Z20822 Contact with and (suspected) exposure to covid-19: Secondary | ICD-10-CM

## 2020-06-30 LAB — SARS-COV-2, NAA 2 DAY TAT

## 2020-06-30 LAB — NOVEL CORONAVIRUS, NAA: SARS-CoV-2, NAA: NOT DETECTED

## 2020-07-19 IMAGING — US US OB COMP LESS 14 WK
2 series · 15 of 28 positions shown · non-contrast
Comparison: 07/11/2019

CLINICAL DATA: Vaginal bleeding, quantitative beta HCG [DATE],
[id number]

EXAM:
OBSTETRIC <14 WK ULTRASOUND
TECHNIQUE: Transabdominal ultrasound was performed for evaluation of the
gestation as well as the maternal uterus and adnexal regions.

[Series 1: us ob comp less 14 wk · 14 of 47 slices shown (1 of 2)]
[im 1/47]
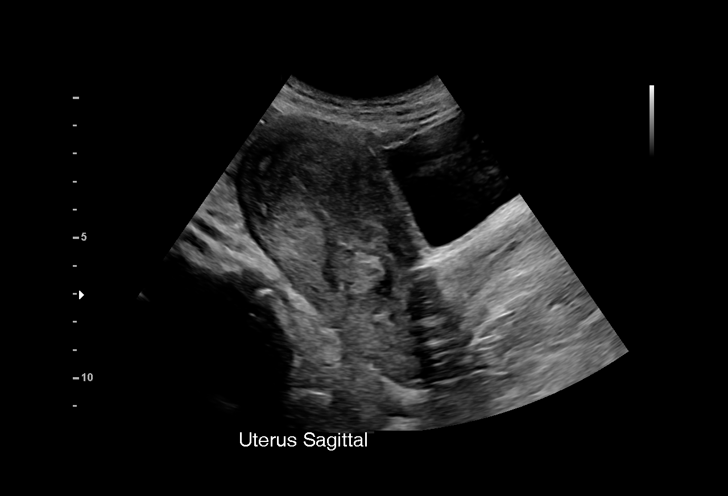
[im 4/47]
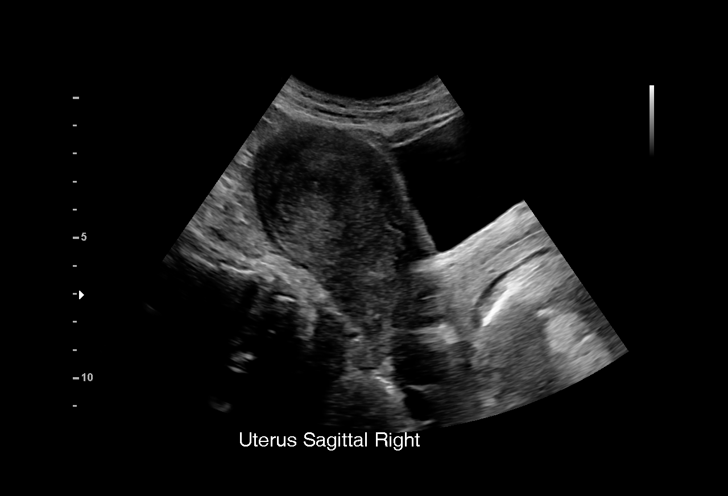
[im 8/47]
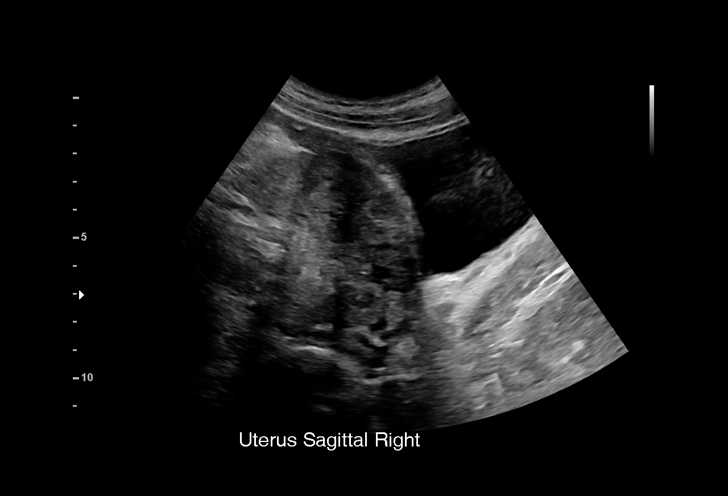
[im 12/47]
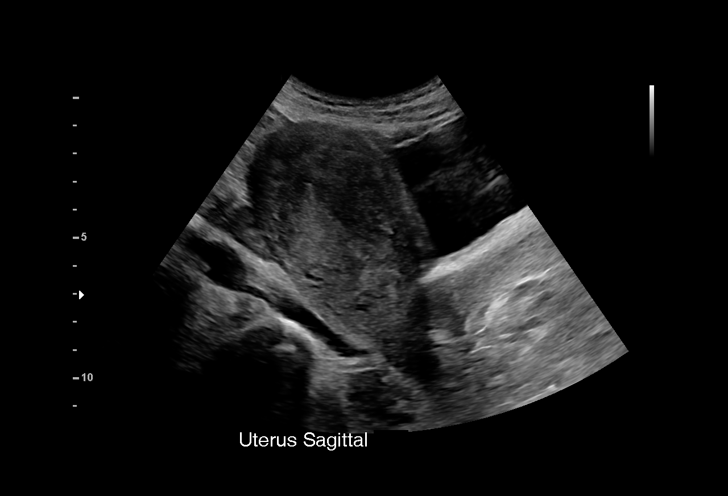
[im 15/47]
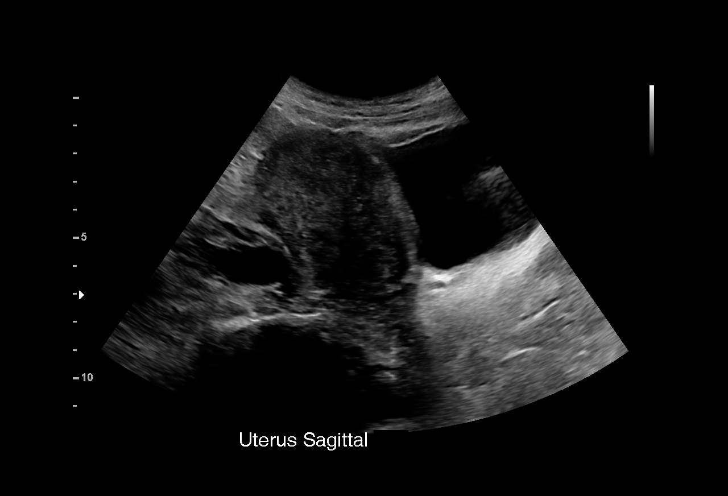
[im 19/47]
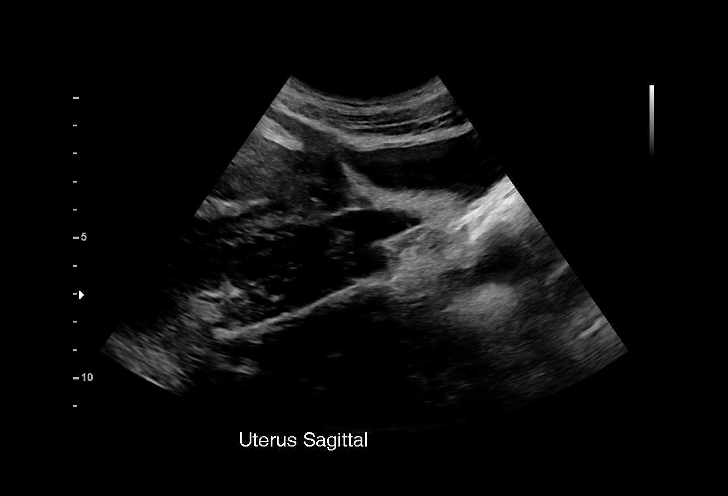
[im 23/47]
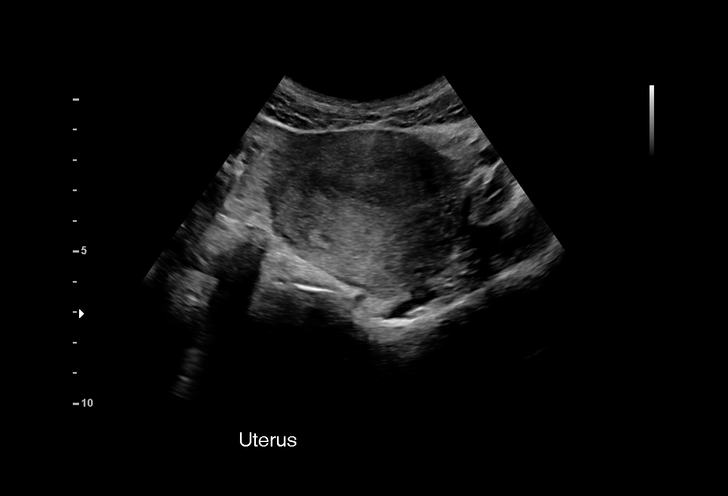
[im 26/47]
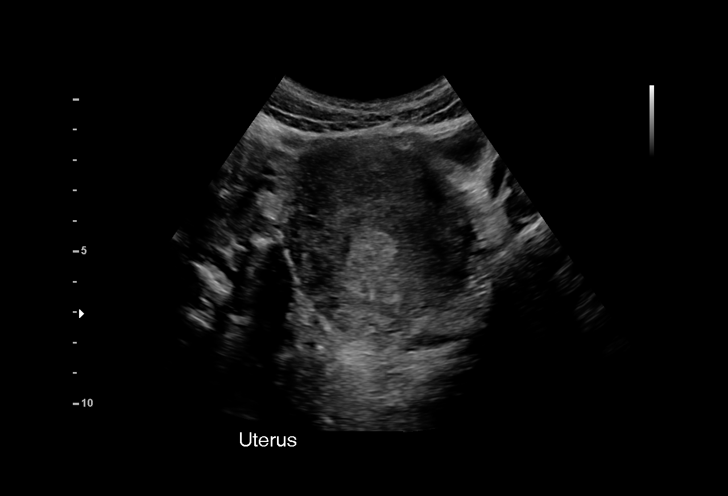
[im 28/47]
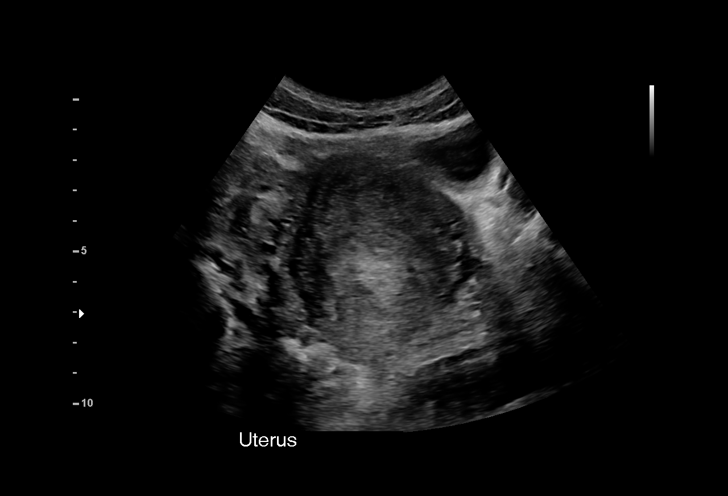
[im 32/47]
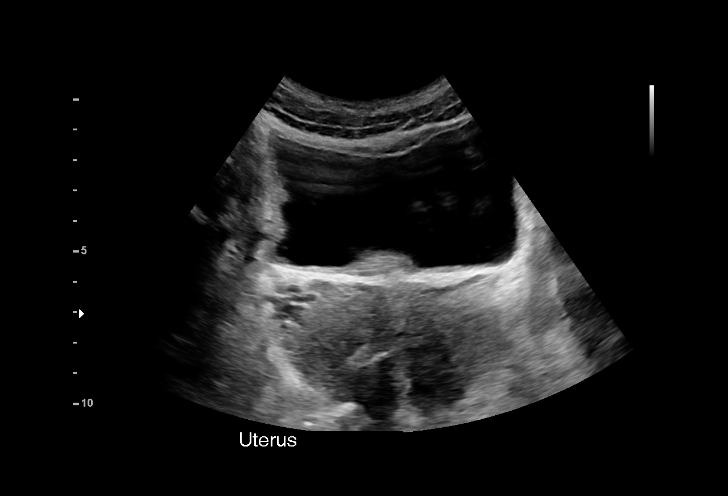
[im 35/47]
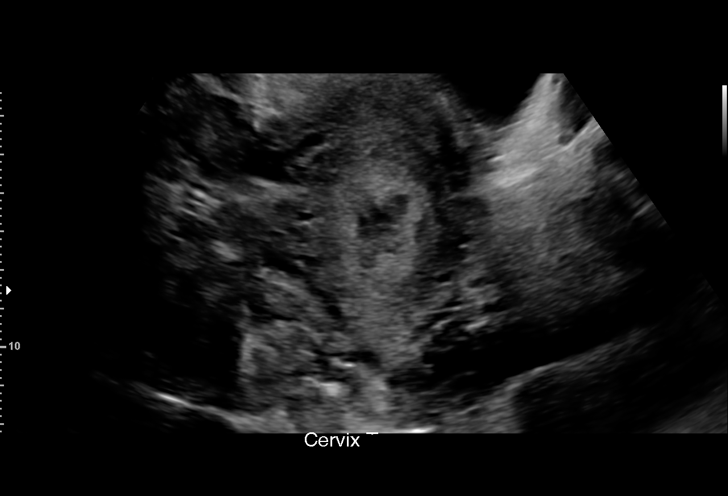
[im 39/47]
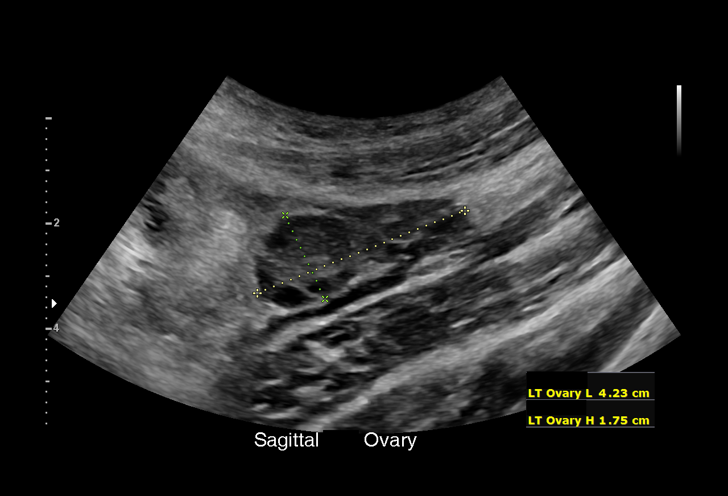
[im 43/47]
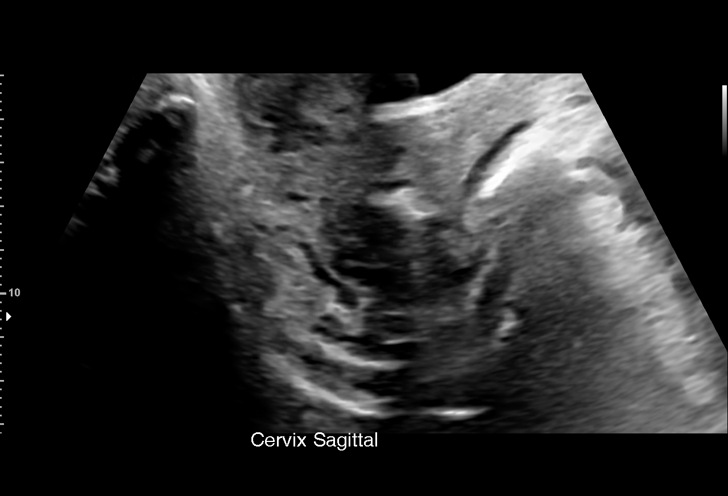
[im 47/47]
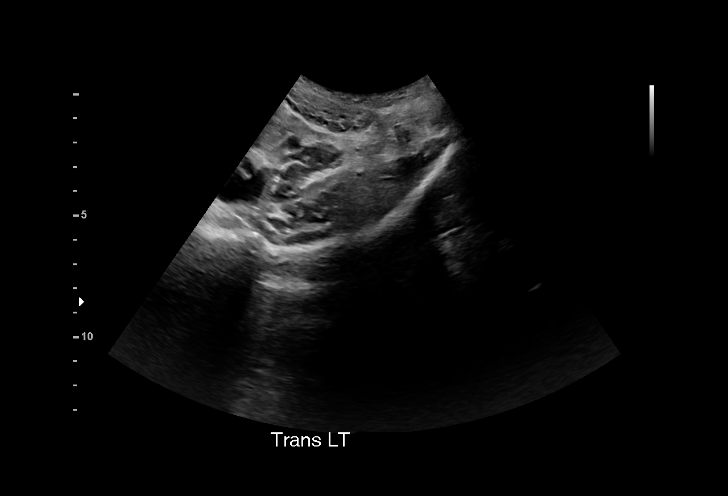

[Series 2: us ob comp less 14 wk · 1 of 4 slices shown (2 of 2)]
[im 4/4]
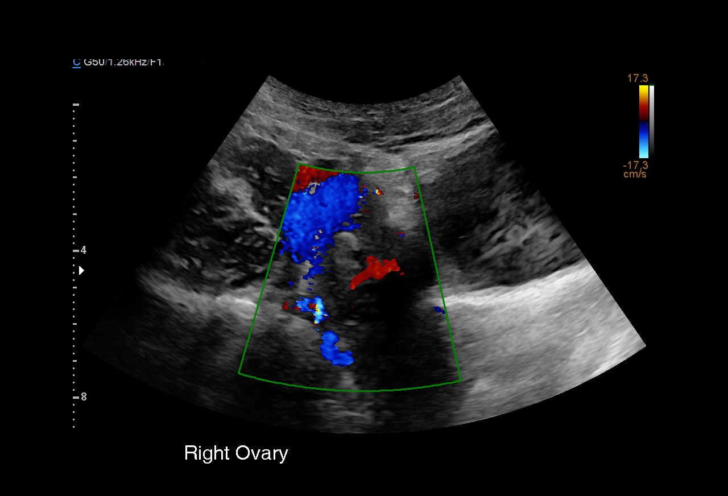

[15 of 28 positions shown; findings below may reference images not displayed]

FINDINGS: Intrauterine gestational sac: None

Yolk sac:  Not Visualized.

Embryo:  Not Visualized.

Maternal uterus/adnexae: The uterus is anteverted. There is complex
echogenic material within the endometrial cavity extending into the
endocervical canal, likely representing spontaneous abortion.

The right ovary measures 3.7 x 3.5 by 2.6 cm and the left ovary
measures 4.2 by 1.8 x 1.9 cm.
IMPRESSION: 1. Complex echogenic material within the endometrial cavity and
endocervical canal, consistent with abortion in progress. The
gestational sac and fetal pole seen previously are no longer
visualized.

## 2021-02-09 ENCOUNTER — Encounter: Payer: Self-pay | Admitting: Emergency Medicine

## 2021-02-09 ENCOUNTER — Ambulatory Visit
Admission: EM | Admit: 2021-02-09 | Discharge: 2021-02-09 | Disposition: A | Discharge status: Home / Self Care | Payer: Commercial Managed Care - PPO | Attending: Emergency Medicine | Admitting: Emergency Medicine

## 2021-02-09 ENCOUNTER — Other Ambulatory Visit: Payer: Self-pay

## 2021-02-09 DIAGNOSIS — Z20822 Contact with and (suspected) exposure to covid-19: Secondary | ICD-10-CM

## 2021-02-09 MED ORDER — DM-GUAIFENESIN ER 30-600 MG PO TB12: 1.0000 | ORAL_TABLET | Freq: Two times a day (BID) | ORAL | 0 refills | Status: DC

## 2021-02-09 MED ORDER — IBUPROFEN 800 MG PO TABS: 800.0000 mg | ORAL_TABLET | Freq: Three times a day (TID) | ORAL | 0 refills | Status: DC

## 2021-02-09 MED ORDER — IBUPROFEN 800 MG PO TABS
800.0000 mg | ORAL_TABLET | Freq: Once | ORAL | Status: AC
  Administered 2021-02-09: 800 mg via ORAL

## 2021-02-09 MED ORDER — BENZONATATE 200 MG PO CAPS: 200.0000 mg | ORAL_CAPSULE | Freq: Three times a day (TID) | ORAL | 0 refills | Status: AC | PRN

## 2021-02-09 NOTE — ED Provider Notes (Signed)
[facility]    CSN: [id number] Arrival date & time: 02/09/21  1535      History   Chief Complaint Chief Complaint  Patient presents with   URI    HPI Claire Russell is a 25 y.o. female.  Presenting today for evaluation of URI symptoms.  Reports that she has had acute onset of cough congestion sore throat, body aches and hot and cold chills beginning today.  Reports recently [place] from [place] approximately 2 to 3 days ago.  Denies known COVID exposures.  Feels joint aches throughout upper and lower extremities.    HPI  History reviewed. No pertinent past medical history.  There are no problems to display for this patient.   History reviewed. No pertinent surgical history.  OB History     Gravida  4   Para  0   Term  0   Preterm  0   AB  3   Living  0      SAB  0   IAB  3   Ectopic  0   Multiple  0   Live Births  0            Home Medications    Prior to Admission medications   Medication Sig Start Date End Date Taking? Authorizing Provider  benzonatate (TESSALON) 200 MG capsule Take 1 capsule (200 mg total) by mouth 3 (three) times daily as needed for up to 7 days for cough. 02/09/21 02/16/21 Yes Sondi Desch C, PA-C  dextromethorphan-guaiFENesin (MUCINEX DM) 30-600 MG 12hr tablet Take 1 tablet by mouth 2 (two) times daily. 02/09/21  Yes Senya Hinzman C, PA-C  ibuprofen (ADVIL) 800 MG tablet Take 1 tablet (800 mg total) by mouth 3 (three) times daily. 02/09/21  Yes Faiza Bansal C, PA-C  misoprostol (CYTOTEC) 200 MCG tablet Take 4 tablets (800 mcg total) by mouth every other day as needed. 07/30/19   Judeth Horn, NP  Prenatal MV & Min w/FA-DHA (PRENATAL ADULT GUMMY/DHA/FA) 0.4-25 MG CHEW Chew 1 each by mouth daily. 07/11/19   Smitty Ackerley C, PA-C  Prenatal Vit-Fe Fumarate-FA (PRENATAL COMPLETE) 14-0.4 MG TABS Take 1 tablet by mouth every morning. 07/21/19   Maghan Jessee, Junius Creamer, PA-C    Family History Family History   Problem Relation Age of Onset   Hypertension Mother    Healthy Father     Social History Social History   Tobacco Use   Smoking status: Former    Packs/day: 0.50    Types: Cigarettes   Smokeless tobacco: Never  Vaping Use   Vaping Use: Never used  Substance Use Topics   Alcohol use: Yes   Drug use: No     Allergies   Patient has no known allergies.   Review of Systems Review of Systems  Constitutional:  Positive for fever. Negative for activity change, appetite change, chills and fatigue.  HENT:  Positive for rhinorrhea and sore throat. Negative for congestion, ear pain, sinus pressure and trouble swallowing.   Eyes:  Negative for discharge and redness.  Respiratory:  Positive for cough. Negative for chest tightness and shortness of breath.   Cardiovascular:  Negative for chest pain.  Gastrointestinal:  Negative for abdominal pain, diarrhea, nausea and vomiting.  Musculoskeletal:  Positive for myalgias.  Skin:  Negative for rash.  Neurological:  Positive for headaches. Negative for dizziness and light-headedness.    Physical Exam Triage Vital Signs ED Triage Vitals  Enc Vitals Group     BP  Pulse      Resp      Temp      Temp src      SpO2      Weight      Height      Head Circumference      Peak Flow      Pain Score      Pain Loc      Pain Edu?      Excl. in GC?    No data found.  Updated Vital Signs BP 139/82 (BP Location: Right Arm)   Pulse 94   Temp 99.3 F (37.4 C) (Oral)   Resp 18   LMP 01/19/2021 (Approximate)   SpO2 96%   Visual Acuity Right Eye Distance:   Left Eye Distance:   Bilateral Distance:    Right Eye Near:   Left Eye Near:    Bilateral Near:     Physical Exam Vitals and nursing note reviewed.  Constitutional:      Appearance: She is well-developed.     Comments: No acute distress  HENT:     Head: Normocephalic and atraumatic.     Ears:     Comments: Bilateral ears without tenderness to palpation of external  auricle, tragus and mastoid, EAC's without erythema or swelling, TM's with good bony landmarks and cone of light. Non erythematous.      Nose: Nose normal.     Mouth/Throat:     Comments: Oral mucosa pink and moist, no tonsillar enlargement or exudate. Posterior pharynx patent and nonerythematous, no uvula deviation or swelling. Normal phonation.  Eyes:     Conjunctiva/sclera: Conjunctivae normal.  Cardiovascular:     Rate and Rhythm: Normal rate and regular rhythm.  Pulmonary:     Effort: Pulmonary effort is normal. No respiratory distress.     Comments: Breathing comfortably at rest, CTABL, no wheezing, rales or other adventitious sounds auscultated  Abdominal:     General: There is no distension.  Musculoskeletal:        General: Normal range of motion.     Cervical back: Neck supple.  Skin:    General: Skin is warm and dry.  Neurological:     Mental Status: She is alert and oriented to person, place, and time.     UC Treatments / Results  Labs (all labs ordered are listed, but only abnormal results are displayed) Labs Reviewed  NOVEL CORONAVIRUS, NAA    EKG   Radiology No results found.  Procedures Procedures (including critical care time)  Medications Ordered in UC Medications  ibuprofen (ADVIL) tablet 800 mg (800 mg Oral Given 02/09/21 1609)    Initial Impression / Assessment and Plan / UC Course  I have reviewed the triage vital signs and the nursing notes.  Pertinent labs & imaging results that were available during my care of the patient were reviewed by me and considered in my medical decision making (see chart for details).    Suspected COVID infection-PCR pending, recommending symptomatic and supportive care rest and fluids.  Recommendations provided.  Continue to monitor,Discussed strict return precautions. Patient verbalized understanding and is agreeable with plan.  Final Clinical Impressions(s) / UC Diagnoses   Final diagnoses:  Suspected  COVID-19 virus infection     Discharge Instructions      COVID test pending, monitor mychart for results Rest and fluids Alternate tylenol and ibuprofen as needed for fever, headaches, bodyaches Tessalon for cough Mucinex dm for congestion/cough May use other over the counter  meds if needed Follow up if not improvig     ED Prescriptions     Medication Sig Dispense Auth. Provider   ibuprofen (ADVIL) 800 MG tablet Take 1 tablet (800 mg total) by mouth 3 (three) times daily. 21 tablet Caitlain Tweed C, PA-C   benzonatate (TESSALON) 200 MG capsule Take 1 capsule (200 mg total) by mouth 3 (three) times daily as needed for up to 7 days for cough. 28 capsule Amora Sheehy C, PA-C   dextromethorphan-guaiFENesin (MUCINEX DM) 30-600 MG 12hr tablet Take 1 tablet by mouth 2 (two) times daily. 16 tablet Ramesha Poster, [place] C, PA-C      PDMP not reviewed this encounter.   Rhonna Holster, Remy C, PA-C 02/10/21 1254

## 2021-02-09 NOTE — Discharge Instructions (Addendum)
COVID test pending, monitor mychart for results Rest and fluids Alternate tylenol and ibuprofen as needed for fever, headaches, bodyaches Tessalon for cough Mucinex dm for congestion/cough May use other over the counter meds if needed Follow up if not improvig

## 2021-02-09 NOTE — ED Triage Notes (Signed)
Patient presents to [facility] for evaluation o f sore throat, nasal congestion, cough, dry mouth, mild headache and body aches and fluctuating temperatures since this afternoon.  States she just got back from [place] Monday.

## 2021-02-10 LAB — SARS-COV-2, NAA 2 DAY TAT

## 2021-02-10 LAB — NOVEL CORONAVIRUS, NAA: SARS-CoV-2, NAA: DETECTED — AB

## 2021-11-23 ENCOUNTER — Ambulatory Visit
Admission: EM | Admit: 2021-11-23 | Discharge: 2021-11-23 | Disposition: A | Discharge status: Home / Self Care | Payer: Commercial Managed Care - PPO | Attending: Emergency Medicine | Admitting: Emergency Medicine

## 2021-11-23 DIAGNOSIS — J309 Allergic rhinitis, unspecified: Secondary | ICD-10-CM

## 2021-11-23 DIAGNOSIS — J9801 Acute bronchospasm: Secondary | ICD-10-CM | POA: Diagnosis not present

## 2021-11-23 DIAGNOSIS — R053 Chronic cough: Secondary | ICD-10-CM

## 2021-11-23 DIAGNOSIS — J4521 Mild intermittent asthma with (acute) exacerbation: Secondary | ICD-10-CM | POA: Diagnosis not present

## 2021-11-23 MED ORDER — DEXAMETHASONE SODIUM PHOSPHATE 10 MG/ML IJ SOLN
10.0000 mg | Freq: Once | INTRAMUSCULAR | Status: AC
  Administered 2021-11-23: 10 mg via INTRAMUSCULAR

## 2021-11-23 MED ORDER — PROMETHAZINE-DM 6.25-15 MG/5ML PO SYRP: 5.0000 mL | ORAL_SOLUTION | Freq: Four times a day (QID) | ORAL | 0 refills | Status: DC | PRN

## 2021-11-23 MED ORDER — ALBUTEROL SULFATE (2.5 MG/3ML) 0.083% IN NEBU
2.5000 mg | INHALATION_SOLUTION | Freq: Once | RESPIRATORY_TRACT | Status: AC
  Administered 2021-11-23: 2.5 mg via RESPIRATORY_TRACT

## 2021-11-23 MED ORDER — FLUTICASONE PROPIONATE 50 MCG/ACT NA SUSP: 1.0000 | Freq: Every day | NASAL | 1 refills | Status: DC

## 2021-11-23 NOTE — Discharge Instructions (Signed)
Please see the list below for recommended medications, dosages and frequencies to provide relief of your current symptoms:    Decadron IM (dexamethasone):  To quickly address your significant respiratory inflammation, you were provided with an injection of Decadron in the office today.  You should continue to feel the full benefit of the steroid for the next 12-24 hours.   Flonase (fluticasone): This is a steroid nasal spray that you use once daily, 1 spray in each nare.  This medication does not work well if you decide to use it only used as you feel you need to, it works best used on a daily basis.  After 3 to 5 days of use, you will notice significant reduction of the inflammation and mucus production that is currently being caused by exposure to allergens, whether seasonal or environmental.  The most common side effect of this medication is nosebleeds.  If you experience a nosebleed, please discontinue use for 1 week, then feel free to resume.  I have provided you with a prescription.     Promethazine DM: Promethazine is both a nasal decongestant and an antinausea medication that makes most patients feel fairly sleepy.  The DM is dextromethorphan, a cough suppressant found in many over-the-counter cough medications.  Please take 5 mL before bedtime to minimize your cough which will help you sleep better.  I have sent a prescription for this medication to your pharmacy.   Please follow-up within the next 5-7 days either with your primary care provider or urgent care if your symptoms do not resolve.  If you do not have a primary care provider, we will assist you in finding one.   Thank you for visiting urgent care today.  We appreciate the opportunity to participate in your care.

## 2021-11-23 NOTE — ED Triage Notes (Addendum)
Pt c/o congestion, generalized body pain, cough and chest pain when coughing occurs.  Started: Saturday  Home interventions: Cough drops, Tessalon pearls and mucinex- not helpful

## 2021-11-23 NOTE — ED Provider Notes (Signed)
[facility]    CSN: [id number] Arrival date & time: 11/23/21  1055    HISTORY   Chief Complaint  Patient presents with   Chest Pain   Generalized Body Aches   Nasal Congestion   HPI Claire Russell is a 26 y.o. female. Patient presents to urgent care today complaining of congestion, generalized body pain, dry, nonproductive persistent cough that is worse at night sometimes causes shortness of breath when coughing, pain in chest when coughing, nasal congestion with clear drainage.  Patient states she tried cough drops, Tessalon Perles and Mucinex and none have been helpful.  Patient denies fever, aches, chills, nausea, vomiting, diarrhea.  Patient endorses a history of mild seasonal allergies, not currently taking allergy medications.  Patient denies history of asthma, known sick contacts.  The history is provided by the patient.  History reviewed. No pertinent past medical history. There are no problems to display for this patient.  History reviewed. No pertinent surgical history. OB History     Gravida  4   Para  0   Term  0   Preterm  0   AB  3   Living  0      SAB  0   IAB  3   Ectopic  0   Multiple  0   Live Births  0          Home Medications    Prior to Admission medications   Not on File   Family History Family History  Problem Relation Age of Onset   Hypertension Mother    Healthy Father    Social History Social History   Tobacco Use   Smoking status: Former    Packs/day: 0.50    Types: Cigarettes   Smokeless tobacco: Never  Vaping Use   Vaping Use: Never used  Substance Use Topics   Alcohol use: Yes   Drug use: No   Allergies   Patient has no known allergies.  Review of Systems Review of Systems Pertinent findings noted in history of present illness.   Physical Exam Triage Vital Signs ED Triage Vitals  Enc Vitals Group     BP 04/15/21 0827 (!) 147/82     Pulse Rate 04/15/21 0827 72     Resp 04/15/21 0827  18     Temp 04/15/21 0827 98.3 F (36.8 C)     Temp Source 04/15/21 0827 Oral     SpO2 04/15/21 0827 98 %     Weight --      Height --      Head Circumference --      Peak Flow --      Pain Score 04/15/21 0826 5     Pain Loc --      Pain Edu? --      Excl. in GC? --   No data found.  Updated Vital Signs BP (!) 139/98 (BP Location: Left Arm)   Pulse 84   Temp 98.3 F (36.8 C) (Oral)   Resp 18   LMP 11/19/2021   SpO2 97%   Breastfeeding No   Physical Exam Vitals and nursing note reviewed.  Constitutional:      General: She is not in acute distress.    Appearance: Normal appearance. She is not ill-appearing.  HENT:     Head: Normocephalic and atraumatic.     Salivary Glands: Right salivary gland is not diffusely enlarged or tender. Left salivary gland is not diffusely enlarged or tender.  Right Ear: Ear canal and external ear normal. No drainage. A middle ear effusion is present. There is no impacted cerumen. Tympanic membrane is bulging. Tympanic membrane is not injected or erythematous.     Left Ear: Ear canal and external ear normal. No drainage. A middle ear effusion is present. There is no impacted cerumen. Tympanic membrane is bulging. Tympanic membrane is not injected or erythematous.     Ears:     Comments: Bilateral EACs normal, both TMs bulging with clear fluid    Nose: Rhinorrhea present. No nasal deformity, septal deviation, signs of injury, nasal tenderness, mucosal edema or congestion. Rhinorrhea is clear.     Right Nostril: Occlusion present. No foreign body, epistaxis or septal hematoma.     Left Nostril: Occlusion present. No foreign body, epistaxis or septal hematoma.     Right Turbinates: Enlarged, swollen and pale.     Left Turbinates: Enlarged, swollen and pale.     Right Sinus: No maxillary sinus tenderness or frontal sinus tenderness.     Left Sinus: No maxillary sinus tenderness or frontal sinus tenderness.     Mouth/Throat:     Lips: Pink. No  lesions.     Mouth: Mucous membranes are moist. No oral lesions.     Pharynx: Oropharynx is clear. Uvula midline. No posterior oropharyngeal erythema or uvula swelling.     Tonsils: No tonsillar exudate. 0 on the right. 0 on the left.     Comments: Postnasal drip Eyes:     General: Lids are normal.        Right eye: No discharge.        Left eye: No discharge.     Extraocular Movements: Extraocular movements intact.     Conjunctiva/sclera: Conjunctivae normal.     Right eye: Right conjunctiva is not injected.     Left eye: Left conjunctiva is not injected.  Neck:     Trachea: Trachea and phonation normal.  Cardiovascular:     Rate and Rhythm: Normal rate and regular rhythm.     Pulses: Normal pulses.     Heart sounds: Normal heart sounds. No murmur heard.   No friction rub. No gallop.  Pulmonary:     Effort: Pulmonary effort is normal. No tachypnea, bradypnea, accessory muscle usage, prolonged expiration, respiratory distress or retractions.     Breath sounds: No stridor, decreased air movement or transmitted upper airway sounds. Examination of the right-upper field reveals decreased breath sounds. Examination of the left-upper field reveals decreased breath sounds. Examination of the right-middle field reveals decreased breath sounds. Examination of the left-middle field reveals decreased breath sounds. Examination of the right-lower field reveals decreased breath sounds. Examination of the left-lower field reveals decreased breath sounds. Decreased breath sounds present. No wheezing, rhonchi or rales.     Comments: Bronchospasm with cough Chest:     Chest wall: No tenderness.  Musculoskeletal:        General: Normal range of motion.     Cervical back: Normal range of motion and neck supple. Normal range of motion.  Lymphadenopathy:     Cervical: No cervical adenopathy.  Skin:    General: Skin is warm and dry.     Findings: No erythema or rash.  Neurological:     General: No focal  deficit present.     Mental Status: She is alert and oriented to person, place, and time.  Psychiatric:        Mood and Affect: Mood normal.  Behavior: Behavior normal.    Visual Acuity Right Eye Distance:   Left Eye Distance:   Bilateral Distance:    Right Eye Near:   Left Eye Near:    Bilateral Near:     UC Couse / Diagnostics / Procedures:    EKG  Radiology No results found.  Procedures Procedures (including critical care time)  UC Diagnoses / Final Clinical Impressions(s)   I have reviewed the triage vital signs and the nursing notes.  Pertinent labs & imaging results that were available during my care of the patient were reviewed by me and considered in my medical decision making (see chart for details).   Final diagnoses:  Allergic rhinitis, unspecified seasonality, unspecified trigger  Bronchospasm  Mild intermittent reactive airway disease with acute exacerbation  Persistent dry cough   Patient did not have any oral full response to albuterol neb treatment.  Patient provided with an injection of dexamethasone and advised to begin Flonase nasal spray.  Patient provided with Promethazine DM for cough at night.  Return precautions advised.  Given duration of symptoms, viral testing no longer indicated.  Antibiotics not indicated based on physical exam findings.  ED Prescriptions     Medication Sig Dispense Auth. Provider   fluticasone (FLONASE) 50 MCG/ACT nasal spray Place 1 spray into both nostrils daily. Begin by using 2 sprays in each nare daily for 3 to 5 days, then decrease to 1 spray in each nare daily. 16 mL Theadora Rama Scales, PA-C   promethazine-dextromethorphan (PROMETHAZINE-DM) 6.25-15 MG/5ML syrup Take 5 mLs by mouth 4 (four) times daily as needed for cough. 180 mL Theadora Rama Scales, PA-C      PDMP not reviewed this encounter.  Pending results:  Labs Reviewed - No data to display  Medications Ordered in UC: Medications  albuterol  (PROVENTIL) (2.5 MG/3ML) 0.083% nebulizer solution 2.5 mg (2.5 mg Nebulization Given 11/23/21 1137)  dexamethasone (DECADRON) injection 10 mg (10 mg Intramuscular Given 11/23/21 1203)    Disposition Upon Discharge:  Condition: stable for discharge home Home: take medications as prescribed; routine discharge instructions as discussed; follow up as advised.  Patient presented with an acute illness with associated systemic symptoms and significant discomfort requiring urgent management. In my opinion, this is a condition that a prudent lay person (someone who possesses an average knowledge of health and medicine) may potentially expect to result in complications if not addressed urgently such as respiratory distress, impairment of bodily function or dysfunction of bodily organs.   Routine symptom specific, illness specific and/or disease specific instructions were discussed with the patient and/or caregiver at length.   As such, the patient has been evaluated and assessed, work-up was performed and treatment was provided in alignment with urgent care protocols and evidence based medicine.  Patient/parent/caregiver has been advised that the patient may require follow up for further testing and treatment if the symptoms continue in spite of treatment, as clinically indicated and appropriate.  If the patient was tested for COVID-19, Influenza and/or RSV, then the patient/parent/guardian was advised to isolate at home pending the results of his/her diagnostic coronavirus test and potentially longer if they're positive. I have also advised pt that if his/her COVID-19 test returns positive, it's recommended to self-isolate for at least 10 days after symptoms first appeared AND until fever-free for 24 hours without fever reducer AND other symptoms have improved or resolved. Discussed self-isolation recommendations as well as instructions for household member/close contacts as per the CDC and [place] DHHS, and also  gave  patient the COVID packet with this information.  Patient/parent/caregiver has been advised to [facility] or [facility]CP in 3-5 days if no better; to PCP or the Emergency Department if new signs and symptoms develop, or if the current signs or symptoms continue to change or worsen for further workup, evaluation and treatment as clinically indicated and appropriate  The patient will follow up with their current PCP if and as advised. If the patient does not currently have a PCP we will assist them in obtaining one.   The patient may need specialty follow up if the symptoms continue, in spite of conservative treatment and management, for further workup, evaluation, consultation and treatment as clinically indicated and appropriate.  Patient/parent/caregiver verbalized understanding and agreement of plan as discussed.  All questions were addressed during visit.  Please see discharge instructions below for further details of plan.  Discharge Instructions:   Discharge Instructions      Please see the list below for recommended medications, dosages and frequencies to provide relief of your current symptoms:    Decadron IM (dexamethasone):  To quickly address your significant respiratory inflammation, you were provided with an injection of Decadron in the office today.  You should continue to feel the full benefit of the steroid for the next 12-24 hours.   Flonase (fluticasone): This is a steroid nasal spray that you use once daily, 1 spray in each nare.  This medication does not work well if you decide to use it only used as you feel you need to, it works best used on a daily basis.  After 3 to 5 days of use, you will notice significant reduction of the inflammation and mucus production that is currently being caused by exposure to allergens, whether seasonal or environmental.  The most common side effect of this medication is nosebleeds.  If you experience a nosebleed, please discontinue use for 1 week,  then feel free to resume.  I have provided you with a prescription.     Promethazine DM: Promethazine is both a nasal decongestant and an antinausea medication that makes most patients feel fairly sleepy.  The DM is dextromethorphan, a cough suppressant found in many over-the-counter cough medications.  Please take 5 mL before bedtime to minimize your cough which will help you sleep better.  I have sent a prescription for this medication to your pharmacy.   Please follow-up within the next 5-7 days either with your primary care provider or urgent care if your symptoms do not resolve.  If you do not have a primary care provider, we will assist you in finding one.   Thank you for visiting urgent care today.  We appreciate the opportunity to participate in your care.       This office note has been dictated using Teaching laboratory technicianDragon speech recognition software.  Unfortunately, and despite my best efforts, this method of dictation can sometimes lead to occasional typographical or grammatical errors.  I apologize in advance if this occurs.     Theadora RamaMorgan, Claryce Friel Scales, PA-C 11/23/21 1218

## 2022-06-05 ENCOUNTER — Ambulatory Visit (INDEPENDENT_AMBULATORY_CARE_PROVIDER_SITE_OTHER): Payer: Commercial Managed Care - PPO

## 2022-06-05 ENCOUNTER — Ambulatory Visit
Admission: EM | Admit: 2022-06-05 | Discharge: 2022-06-05 | Disposition: A | Discharge status: Home / Self Care | Payer: Commercial Managed Care - PPO | Attending: Internal Medicine | Admitting: Internal Medicine

## 2022-06-05 DIAGNOSIS — R058 Other specified cough: Secondary | ICD-10-CM | POA: Diagnosis not present

## 2022-06-05 DIAGNOSIS — R52 Pain, unspecified: Secondary | ICD-10-CM | POA: Insufficient documentation

## 2022-06-05 DIAGNOSIS — Z87891 Personal history of nicotine dependence: Secondary | ICD-10-CM | POA: Insufficient documentation

## 2022-06-05 DIAGNOSIS — Z1152 Encounter for screening for COVID-19: Secondary | ICD-10-CM | POA: Insufficient documentation

## 2022-06-05 DIAGNOSIS — R030 Elevated blood-pressure reading, without diagnosis of hypertension: Secondary | ICD-10-CM

## 2022-06-05 DIAGNOSIS — R0602 Shortness of breath: Secondary | ICD-10-CM | POA: Insufficient documentation

## 2022-06-05 DIAGNOSIS — B349 Viral infection, unspecified: Secondary | ICD-10-CM | POA: Insufficient documentation

## 2022-06-05 LAB — RESP PANEL BY RT-PCR (FLU A&B, COVID) ARPGX2
Influenza A by PCR: POSITIVE — AB
Influenza B by PCR: NEGATIVE
SARS Coronavirus 2 by RT PCR: NEGATIVE

## 2022-06-05 LAB — POCT INFLUENZA A/B
Influenza A, POC: NEGATIVE
Influenza B, POC: NEGATIVE

## 2022-06-05 MED ORDER — OSELTAMIVIR PHOSPHATE 75 MG PO CAPS: 75.0000 mg | ORAL_CAPSULE | Freq: Two times a day (BID) | ORAL | 0 refills | Status: DC

## 2022-06-05 MED ORDER — BENZONATATE 100 MG PO CAPS: 100.0000 mg | ORAL_CAPSULE | Freq: Three times a day (TID) | ORAL | 0 refills | Status: DC | PRN

## 2022-06-05 MED ORDER — CETIRIZINE HCL 10 MG PO TABS: 10.0000 mg | ORAL_TABLET | Freq: Every day | ORAL | 0 refills | Status: DC

## 2022-06-05 MED ORDER — IPRATROPIUM BROMIDE 0.03 % NA SOLN: 2.0000 | Freq: Two times a day (BID) | NASAL | 0 refills | Status: DC

## 2022-06-05 MED ORDER — PROMETHAZINE-DM 6.25-15 MG/5ML PO SYRP: 5.0000 mL | ORAL_SOLUTION | Freq: Four times a day (QID) | ORAL | 0 refills | Status: DC | PRN

## 2022-06-05 NOTE — Discharge Instructions (Signed)
We will notify you of your test results as they arrive and may take between about 24 hours.  I encourage you to sign up for MyChart if you have not already done so as this can be the easiest way for [place] to communicate results to you online or through a phone app.  Generally, we only contact you if it is a positive test result.  In the meantime, if you develop worsening symptoms including fever, chest pain, shortness of breath despite our current treatment plan then please report to the emergency room as this may be a sign of worsening status from possible viral infection.  Otherwise, we will manage this as a viral syndrome. For sore throat or cough try using a honey-based tea. Use 3 teaspoons of honey with juice squeezed from half lemon. Place shaved pieces of ginger into 1/2-1 cup of water and warm over stove top. Then mix the ingredients and repeat every 4 hours as needed. Please take Tylenol 500mg -650mg  every 6 hours for aches and pains, fevers. Hydrate very well with at least 2 liters of water. Eat light meals such as soups to replenish electrolytes and soft fruits, veggies. Start an antihistamine like Zyrtec for postnasal drainage, sinus congestion.  You can take this together with the nose spray as needed for the same kind of congestion.  Use the cough medications as needed.

## 2022-06-05 NOTE — ED Provider Notes (Signed)
[facility]  Note:  This document was prepared using Conservation officer, historic buildings and may include unintentional dictation errors.  MRN: [id number] DOB: [birth year]  Subjective:   Claire Russell is a 26 y.o. female presenting for acute onset in the past 24 hours sinus congestion, sinus drainage, coughing, shortness of breath, body pains.  No overt chest pain, wheezing.  No history of asthma.  No current facility-administered medications for this encounter.  Current Outpatient Medications:    fluticasone (FLONASE) 50 MCG/ACT nasal spray, Place 1 spray into both nostrils daily. Begin by using 2 sprays in each nare daily for 3 to 5 days, then decrease to 1 spray in each nare daily., Disp: 16 mL, Rfl: 1   promethazine-dextromethorphan (PROMETHAZINE-DM) 6.25-15 MG/5ML syrup, Take 5 mLs by mouth 4 (four) times daily as needed for cough., Disp: 180 mL, Rfl: 0   No Known Allergies  History reviewed. No pertinent past medical history.   History reviewed. No pertinent surgical history.  Family History  Problem Relation Age of Onset   Hypertension Mother    Healthy Father     Social History   Tobacco Use   Smoking status: Former    Packs/day: 0.50    Types: Cigarettes   Smokeless tobacco: Never  Vaping Use   Vaping Use: Never used  Substance Use Topics   Alcohol use: Yes    Comment: occ   Drug use: No    ROS   Objective:   Vitals: BP (!) 168/113 (BP Location: Left Arm)   Pulse 78   Temp 99.4 F (37.4 C) (Oral)   Resp 20   LMP 05/10/2022   SpO2 96%   BP Readings from Last 3 Encounters:  06/05/22 (!) 168/113  11/23/21 (!) 139/98  02/09/21 139/82   Physical Exam Constitutional:      General: She is not in acute distress.    Appearance: Normal appearance. She is well-developed. She is not ill-appearing, toxic-appearing or diaphoretic.  HENT:     Head: Normocephalic and atraumatic.     Nose: Nose normal.     Mouth/Throat:     Mouth:  Mucous membranes are moist.  Eyes:     General: No scleral icterus.       Right eye: No discharge.        Left eye: No discharge.     Extraocular Movements: Extraocular movements intact.  Cardiovascular:     Rate and Rhythm: Normal rate and regular rhythm.     Heart sounds: Normal heart sounds. No murmur heard.    No friction rub. No gallop.  Pulmonary:     Effort: Pulmonary effort is normal. No respiratory distress.     Breath sounds: No stridor. No wheezing, rhonchi or rales.  Chest:     Chest wall: No tenderness.  Skin:    General: Skin is warm and dry.  Neurological:     General: No focal deficit present.     Mental Status: She is alert and oriented to person, place, and time.  Psychiatric:        Mood and Affect: Mood normal.        Behavior: Behavior normal.     Results for orders placed or performed during the hospital encounter of 06/05/22 (from the past 24 hour(s))  POCT Influenza A/B     Status: None   Collection Time: 06/05/22  2:23 PM  Result Value Ref Range   Influenza A, POC Negative Negative   Influenza  B, POC Negative Negative   DG Chest 2 View  Result Date: 06/05/2022 CLINICAL DATA:  Cough and shortness of breath with body aches 1 day per EXAM: CHEST - 2 VIEW COMPARISON:  None Available. FINDINGS: Lungs are adequately inflated and otherwise clear. Cardiomediastinal silhouette, bones and soft tissues are normal. Electronically Signed   By: Elberta Fortis M.D.   On: 06/05/2022 14:51    Assessment and Plan :   PDMP not reviewed this encounter.  1. Acute viral syndrome   2. Body aches   3. Shortness of breath   4. Elevated blood pressure reading without diagnosis of hypertension     Will cover for influenza with Tamiflu given overall symptom set, current incidence in the community.  Use supportive care, rest, fluids, hydration, light meals, schedule Tylenol and ibuprofen.  Chest x-ray negative.  Will repeat an influenza test and also a COVID test.  Should  patient test positive for COVID-19, recommend Paxlovid.  Counseled patient on potential for adverse effects with medications prescribed today, patient verbalized understanding. ER and return-to-clinic precautions discussed, patient verbalized understanding.    Wallis Bamberg, [place] 06/06/22 [phone]

## 2022-06-05 NOTE — ED Triage Notes (Signed)
Pt c/o cough/SHOB, URI sx, body aches x 24 hours

## 2023-03-13 ENCOUNTER — Ambulatory Visit
Admission: EM | Admit: 2023-03-13 | Discharge: 2023-03-13 | Disposition: A | Discharge status: Home / Self Care | Payer: Commercial Managed Care - PPO | Attending: Internal Medicine | Admitting: Internal Medicine

## 2023-03-13 DIAGNOSIS — Z1152 Encounter for screening for COVID-19: Secondary | ICD-10-CM | POA: Insufficient documentation

## 2023-03-13 DIAGNOSIS — R030 Elevated blood-pressure reading, without diagnosis of hypertension: Secondary | ICD-10-CM | POA: Insufficient documentation

## 2023-03-13 DIAGNOSIS — B349 Viral infection, unspecified: Secondary | ICD-10-CM | POA: Insufficient documentation

## 2023-03-13 MED ORDER — ONDANSETRON 8 MG PO TBDP: 8.0000 mg | ORAL_TABLET | Freq: Three times a day (TID) | ORAL | 0 refills | Status: AC | PRN

## 2023-03-13 MED ORDER — IPRATROPIUM BROMIDE 0.03 % NA SOLN: 2.0000 | Freq: Two times a day (BID) | NASAL | 0 refills | Status: AC

## 2023-03-13 MED ORDER — ACETAMINOPHEN 325 MG PO TABS: 650.0000 mg | ORAL_TABLET | Freq: Four times a day (QID) | ORAL | 0 refills | Status: AC | PRN

## 2023-03-13 MED ORDER — PROMETHAZINE-DM 6.25-15 MG/5ML PO SYRP: 5.0000 mL | ORAL_SOLUTION | Freq: Four times a day (QID) | ORAL | 0 refills | Status: AC | PRN

## 2023-03-13 MED ORDER — BENZONATATE 100 MG PO CAPS: 100.0000 mg | ORAL_CAPSULE | Freq: Three times a day (TID) | ORAL | 0 refills | Status: DC | PRN

## 2023-03-13 MED ORDER — AMLODIPINE BESYLATE 5 MG PO TABS: 5.0000 mg | ORAL_TABLET | Freq: Every day | ORAL | 0 refills | Status: AC

## 2023-03-13 NOTE — Discharge Instructions (Addendum)
We will notify you of your test results as they arrive and may take between about 24 hours.  I encourage you to sign up for MyChart if you have not already done so as this can be the easiest way for [place] to communicate results to you online or through a phone app.  Generally, we only contact you if it is a positive test result.  In the meantime, if you develop worsening symptoms including fever, chest pain, shortness of breath despite our current treatment plan then please report to the emergency room as this may be a sign of worsening status from possible viral infection.  Otherwise, we will manage this as a viral syndrome. For sore throat or cough try using a honey-based tea. Use 3 teaspoons of honey with juice squeezed from half lemon. Place shaved pieces of ginger into 1/2-1 cup of water and warm over stove top. Then mix the ingredients and repeat every 4 hours as needed. Please take Tylenol 650mg  every 6 hours for aches and pains, fevers. Hydrate very well with at least 2 liters of water. Eat light meals such as soups to replenish electrolytes and soft fruits, veggies. Start an antihistamine like Zyrtec (10mg  daily) for postnasal drainage, sinus congestion.  You can take this together with Atrovent nasal spray 2 times a day as needed for the same kind of congestion.  Use the cough medications as needed.     For diabetes or elevated blood sugar, please make sure you are limiting and avoiding starchy, carbohydrate foods like pasta, breads, sweet breads, pastry, rice, potatoes, desserts. These foods can elevate your blood sugar. Also, limit and avoid drinks that contain a lot of sugar such as sodas, sweet teas, fruit juices.  Drinking plain water will be much more helpful, try 64 ounces of water daily.  It is okay to flavor your water naturally by cutting cucumber, lemon, mint or lime, placing it in a picture with water and drinking it over a period of 24-48 hours as long as it remains refrigerated.  For  elevated blood pressure, make sure you are monitoring salt in your diet.  Do not eat restaurant foods and limit processed foods at home. I highly recommend you prepare and cook your own foods at home.  Processed foods include things like frozen meals, pre-seasoned meats and dinners, deli meats, canned foods as these foods contain a high amount of sodium/salt.  Make sure you are paying attention to sodium labels on foods you buy at the grocery store. Buy your spices separately such as garlic powder, onion powder, cumin, cayenne, parsley flakes so that you can avoid seasonings that contain salt. However, salt-free seasonings are available and can be used, an example is Mrs. Dash and includes a lot of different mixtures that do not contain salt.  Lastly, when cooking using oils that are healthier for you is important. This includes olive oil, avocado oil, canola oil. We have discussed a lot of foods to avoid but below is a list of foods that can be very healthy to use in your diet whether it is for diabetes, cholesterol, high blood pressure, or in general healthy eating.  Salads - kale, spinach, cabbage, spring mix, arugula Fruits - avocadoes, berries (blueberries, raspberries, blackberries), apples, oranges, pomegranate, grapefruit, kiwi Vegetables - asparagus, cauliflower, broccoli, green beans, brussel sprouts, bell peppers, beets; stay away from or limit starchy vegetables like potatoes, carrots, peas Other general foods - kidney beans, egg whites, almonds, walnuts, sunflower seeds, pumpkin seeds, fat  free yogurt, almond milk, flax seeds, quinoa, oats  Meat - It is better to eat lean meats and limit your red meat including pork to once a week.  Wild caught fish, chicken breast are good options as they tend to be leaner sources of good protein. Still be mindful of the sodium labels for the meats you buy.  DO NOT EAT ANY FOODS ON THIS LIST THAT YOU ARE ALLERGIC TO. For more specific needs, I highly  recommend consulting a dietician or nutritionist but this can definitely be a good starting point.  Check your blood pressures at home. Should have readings 110-130 systolic (top number) over 70-80 diastolic (bottom number). If it stays above these readings for 3 consecutive readings, then go ahead and start amlodipine. Follow up with a new PCP.

## 2023-03-13 NOTE — ED Triage Notes (Signed)
Pt c/o cough, head/chest congestion, HA x 4 days taking rx cough med x 2 amd OTC meds-worse since last night-NAD-steady gait

## 2023-03-13 NOTE — ED Provider Notes (Signed)
[facility]  Note:  This document was prepared using Conservation officer, historic buildings and may include unintentional dictation errors.  MRN: [id number] DOB: Sep 21, 1995  Subjective:   Claire Russell is a 27 y.o. female presenting for 4-day history of acute onset sinus congestion and chest congestion, productive cough, malaise and fatigue, body pains.  Patient has been using prescription cough medications, over-the-counter medications.  She has not gotten any relief, feels worse since last night.  Would like to be checked for COVID.  Regarding her blood pressure, patient is not currently taking anything for this.  She did have a PCP that advised her to moderate her blood pressure.  Whenever she went into her PCPs office, reports that her blood pressure was normal.  However, when she has gone to the other clinics has been significantly elevated.  Has not followed up with her in some time.  No smoking of any kind including cigarettes, cigars, vaping, marijuana use.    No chronic medications.  No Known Allergies  History reviewed. No pertinent past medical history.   History reviewed. No pertinent surgical history.  Family History  Problem Relation Age of Onset   Hypertension Mother    Healthy Father     Social History   Tobacco Use   Smoking status: Former    Current packs/day: 0.50    Types: Cigarettes   Smokeless tobacco: Never  Vaping Use   Vaping status: Never Used  Substance Use Topics   Alcohol use: Yes    Comment: occ   Drug use: No    ROS   Objective:   Vitals: BP (!) 162/117 (BP Location: Left Arm)   Pulse 93   Temp 99.6 F (37.6 C) (Oral)   Resp 20   LMP 02/26/2023   SpO2 96%   BP Readings from Last 3 Encounters:  03/13/23 (!) 162/117  06/05/22 (!) 168/113  11/23/21 (!) 139/98   Physical Exam Constitutional:      General: She is not in acute distress.    Appearance: Normal appearance. She is well-developed and normal  weight. She is not ill-appearing, toxic-appearing or diaphoretic.  HENT:     Head: Normocephalic and atraumatic.     Right Ear: Tympanic membrane, ear canal and external ear normal. No drainage or tenderness. No middle ear effusion. There is no impacted cerumen. Tympanic membrane is not erythematous or bulging.     Left Ear: Tympanic membrane, ear canal and external ear normal. No drainage or tenderness.  No middle ear effusion. There is no impacted cerumen. Tympanic membrane is not erythematous or bulging.     Nose: Congestion present. No rhinorrhea.     Mouth/Throat:     Mouth: Mucous membranes are moist. No oral lesions.     Pharynx: No pharyngeal swelling, oropharyngeal exudate, posterior oropharyngeal erythema or uvula swelling.     Tonsils: No tonsillar exudate or tonsillar abscesses.  Eyes:     General: No scleral icterus.       Right eye: No discharge.        Left eye: No discharge.     Extraocular Movements: Extraocular movements intact.     Right eye: Normal extraocular motion.     Left eye: Normal extraocular motion.     Conjunctiva/sclera: Conjunctivae normal.  Cardiovascular:     Rate and Rhythm: Normal rate and regular rhythm.     Heart sounds: Normal heart sounds. No murmur heard.    No friction rub. No gallop.  Pulmonary:     Effort: Pulmonary effort is normal. No respiratory distress.     Breath sounds: No stridor. No wheezing, rhonchi or rales.  Chest:     Chest wall: No tenderness.  Musculoskeletal:     Cervical back: Normal range of motion and neck supple.  Lymphadenopathy:     Cervical: No cervical adenopathy.  Skin:    General: Skin is warm and dry.  Neurological:     General: No focal deficit present.     Mental Status: She is alert and oriented to person, place, and time.     Cranial Nerves: No cranial nerve deficit.     Motor: No weakness.     Coordination: Coordination normal.     Gait: Gait normal.  Psychiatric:        Mood and Affect: Mood normal.         Behavior: Behavior normal.        Thought Content: Thought content normal.        Judgment: Judgment normal.     Assessment and Plan :   PDMP not reviewed this encounter.  1. Acute viral syndrome   2. Elevated blood pressure reading in office without diagnosis of hypertension    Deferred imaging given clear cardiopulmonary exam, hemodynamically stable vital signs. Will manage for viral illness such as viral URI, viral syndrome, viral rhinitis, COVID-19. Recommended supportive care. Offered scripts for symptomatic relief. Testing is pending.  Had an extensive discussion with patient about hypertensive friendly diet, monitoring for her blood pressure.  We agreed to start amlodipine if she gets 3 consecutive elevated blood pressure readings.  I advised that she follow-up with a new PCP or her previous one.  Counseled patient on potential for adverse effects with medications prescribed/recommended today, ER and return-to-clinic precautions discussed, patient verbalized understanding.     Wallis Bamberg, PA-C 03/13/23 1039

## 2023-03-14 LAB — SARS CORONAVIRUS 2 (TAT 6-24 HRS): SARS Coronavirus 2: NEGATIVE

## 2023-07-27 ENCOUNTER — Ambulatory Visit: Payer: Self-pay | Admitting: Internal Medicine

## 2023-10-19 ENCOUNTER — Encounter: Payer: Self-pay | Admitting: Internal Medicine

## 2023-10-19 ENCOUNTER — Other Ambulatory Visit: Payer: Self-pay

## 2023-10-19 ENCOUNTER — Ambulatory Visit (INDEPENDENT_AMBULATORY_CARE_PROVIDER_SITE_OTHER): Payer: Self-pay | Admitting: Internal Medicine

## 2023-10-19 VITALS — BP 118/78 | HR 86 | Temp 98.3°F | Resp 18 | Ht 62.6 in | Wt 177.2 lb

## 2023-10-19 DIAGNOSIS — L2389 Allergic contact dermatitis due to other agents: Secondary | ICD-10-CM

## 2023-10-19 DIAGNOSIS — W57XXXD Bitten or stung by nonvenomous insect and other nonvenomous arthropods, subsequent encounter: Secondary | ICD-10-CM | POA: Diagnosis not present

## 2023-10-19 DIAGNOSIS — J3089 Other allergic rhinitis: Secondary | ICD-10-CM

## 2023-10-19 DIAGNOSIS — J4520 Mild intermittent asthma, uncomplicated: Secondary | ICD-10-CM | POA: Diagnosis not present

## 2023-10-19 MED ORDER — TRIAMCINOLONE ACETONIDE 0.1 % EX OINT: TOPICAL_OINTMENT | CUTANEOUS | 1 refills | Status: DC

## 2023-10-19 MED ORDER — AIRSUPRA 90-80 MCG/ACT IN AERO: 2.0000 | INHALATION_SPRAY | RESPIRATORY_TRACT | 5 refills | Status: AC | PRN

## 2023-10-19 MED ORDER — FLUTICASONE PROPIONATE 50 MCG/ACT NA SUSP: 1.0000 | Freq: Two times a day (BID) | NASAL | 2 refills | Status: DC

## 2023-10-19 MED ORDER — CETIRIZINE HCL 10 MG PO TABS: 10.0000 mg | ORAL_TABLET | Freq: Every day | ORAL | 5 refills | Status: DC

## 2023-10-19 NOTE — Progress Notes (Unsigned)
 NEW PATIENT Date of Service/Encounter:  10/19/23 Referring provider: Elester Grim, MD Primary care provider: Elester Grim, MD  Subjective:  Claire Russell is a 28 y.o. female  presenting today for evaluation of mold exposure, rhinitis and asthma  History obtained from: chart review and patient.   Discussed the use of AI scribe software for clinical note transcription with the patient, who gave verbal consent to proceed.  History of Present Illness Claire Russell is a 28 year old female who presents with respiratory symptoms following mold exposure. She was referred by her primary doctor for further evaluation of her respiratory symptoms.  She has been experiencing respiratory symptoms since 2023, which she attributes to mold exposure in her apartment. Symptoms include shortness of breath, chest tightness, and rhinorrhea. She has no history of allergies or asthma prior to this exposure.  She moved into her apartment four years ago and discovered mold before her symptoms began. Despite reporting it to maintenance, the issue was not adequately addressed, leading to worsening mold exposure. She has had multiple urgent care visits due to these symptoms, with notable flares in June 2023, December 2023, and September 2024.  Her treatment history includes the use of an albuterol  inhaler as needed, approximately twice a week, and she has received dexamethasone  injections in the past year to manage her symptoms. She has recently moved out of the mold-affected apartment and notes some improvement in her symptoms, although she continues to experience rhinorrhea and occasional chest tightness.  She has no known history of asthma as a child and no typical allergy symptoms prior to this incident. 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    ED visit:  03/13/23, 06/05/22, 11/23/21:   Other  allergy screening: Asthma: no Rhino conjunctivitis: no Food allergy: no Medication allergy: no Hymenoptera allergy:  skeeter syndrome  Urticaria: no Eczema:no History of recurrent infections suggestive of immunodeficency: no Vaccinations are up to date.   Past Medical History: Past Medical History:  Diagnosis Date   Recurrent upper respiratory infection (URI)    Medication List:  Current Outpatient Medications  Medication Sig Dispense Refill   acetaminophen  (TYLENOL ) 325 MG tablet Take 2 tablets (650 mg total) by mouth every 6 (six) hours as needed for moderate pain. 30 tablet 0   albuterol  (VENTOLIN  HFA) 108 (90 Base) MCG/ACT inhaler Inhale 2 puffs into the lungs every 4 (four) hours as needed.     amLODipine  (NORVASC ) 5 MG tablet Take 1 tablet (5 mg total) by mouth daily. 30 tablet 0   benzonatate  (TESSALON ) 100 MG capsule Take 1 capsule (100 mg total) by mouth 3 (three) times daily as needed for cough. 30 capsule 0   fluconazole (DIFLUCAN) 150 MG tablet Take 150 mg by mouth every 3 (three) days.     norethindrone (MICRONOR) 0.35 MG tablet Take 1 tablet by mouth daily.     ondansetron  (ZOFRAN -ODT) 8 MG disintegrating tablet Take 1 tablet (8 mg total) by mouth every 8 (eight) hours as needed for nausea or vomiting. 20 tablet 0   ipratropium (ATROVENT ) 0.03 % nasal spray Place 2 sprays into both nostrils 2 (two) times daily. 30 mL 0   promethazine -dextromethorphan (PROMETHAZINE -DM) 6.25-15 MG/5ML syrup Take 5 mLs by mouth 4 (four) times daily as needed for cough. 200 mL 0   No current facility-administered medications for this visit.   Known Allergies:  Allergies  Allergen Reactions  Mosquito (Diagnostic) Rash   Past Surgical History: History reviewed. No pertinent surgical history. Family History: Family History  Problem Relation Age of Onset   Eczema Mother    Allergic rhinitis Mother    Hypertension Mother    Healthy Father    Social History: Claire Russell lives apartment,  28 years old.  Wood in the family removing carpet in the bedroom.  Electric heating and central cooling.  No rashes but has not been as typical for her.  Vape exposure inside the home but not the car.  She does not smoke.  No dust mite precautions.  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   ROS:  All other systems negative except as noted per HPI.  Objective:  Blood pressure 118/78, pulse 86, temperature 98.3 F (36.8 C), temperature source Temporal, resp. rate 18, height 5' 2.6" (1.59 m), weight 177 lb 3.2 oz (80.4 kg), SpO2 98%. Body mass index is 31.79 kg/m. Physical Exam:  General Appearance:  Alert, cooperative, no distress, appears stated age  Head:  Normocephalic, without obvious abnormality, atraumatic  Eyes:  Conjunctiva clear, EOM's intact  Ears EACs normal bilaterally  Nose: Nares normal,  erythematous nasal, hypertrophic turbinates, no visible anterior polyps, and septum midline  Throat: Lips, tongue normal; teeth and gums normal, normal posterior oropharynx  Neck: Supple, symmetrical  Lungs:   clear to auscultation bilaterally, Respirations unlabored, no coughing  Heart:  regular rate and rhythm and no murmur, Appears well perfused  Extremities: No edema  Skin: Skin color, texture, turgor normal and no rashes or lesions on visualized portions of skin  Neurologic: No gross deficits   Diagnostics: Spirometry:  Tracings reviewed. Her effort: Good reproducible efforts. FVC: 2.84L (pre),  FEV1: 2.42L, 93% predicted (pre),  FEV1/FVC ratio: 85 (pre),  Interpretation: Spirometry consistent with normal pattern.  Please see scanned spirometry results for details.   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 Symptoms improved after moving but outdoor exposure remains a risk. -  Schedule allergy testing (1-55) . Off antihistamines 3 days prior. - Start daily antihistamine:  -Options include Zyrtec  (Cetirizine ) 10mg , Claritin (Loratadine) 10mg , Allegra (Fexofenadine) 180mg , or Xyzal (Levocetirinze) 5mg  - Can be purchased over-the-counter if not covered by insurance. - Start flonase  1 spray per nostril twice daily. 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 Symptoms include dyspnea and chest tightness, occurring twice weekly. Airsupra may improve management. - Switch to Airsupra 2 puffs every 4-6 hours as needed for cough, wheeze, dyspnea - Rinse mouth out after use - Exhale fully before each puff - Use Albuterol  (Proair /Ventolin ) 2 puffs every 4-6 hours as needed for chest tightness, wheezing, or coughing - Use Albuterol  (Proair /Ventolin ) 2 puffs 15 minutes prior to exercise if you have symptoms with activity - Use a spacer with all inhalers - please keep track of how often you are needing rescue inhaler Albuterol  (Proair /Ventolin ) as this will help guide future management - Asthma is not controlled if:  - Symptoms are occurring >2 times a week  during the day  OR  - >2 times a month nighttime awakenings  - Please call the clinic to schedule a follow up if these symptoms arise   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 Topical steroids  and antihistamines recommended. - Avoidance measures (DEET repellant for mosquitos, long clothing, etc) If bite occurs with raised rash:  - For itch: Topical steroid (hydrocortisone cream) twice daily as needed + oral antihistamine (zyrtec ) - For pain and swelling: Oral anti-inflammatory (ibuprofen ), ice affected area       Follow up: for allergy testing (1-55)  Hold antihistamines for 3 days prior.    This note in its entirety was forwarded to the Provider who requested this consultation.  Other: reviewed spirometry technique and reviewed  inhaler technique  Thank you for your kind referral. I appreciate the opportunity to take part in [facility] care. Please do not hesitate to contact me with questions.  Sincerely,  Thank you so much for letting me partake in your care today.  Don't hesitate to reach out if you have any additional concerns!  Orelia Binet, MD  [facility] [place], [place]

## 2023-10-19 NOTE — Patient Instructions (Signed)
 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 Symptoms improved after moving but outdoor exposure remains a risk. - Schedule allergy testing (1-55) . Off antihistamines 3 days prior. - Start daily antihistamine:  -Options include Zyrtec  (Cetirizine ) 10mg , Claritin (Loratadine) 10mg , Allegra (Fexofenadine) 180mg , or Xyzal (Levocetirinze) 5mg  - Can be purchased over-the-counter if not covered by insurance. - Start flonase  1 spray per nostril twice daily. 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 Symptoms include dyspnea and chest tightness, occurring twice weekly. Airsupra may improve management. - Switch to Airsupra 2 puffs every 4-6 hours as needed for cough, wheeze, dyspnea - Rinse mouth out after use - Exhale fully before each puff - Use Albuterol  (Proair /Ventolin ) 2 puffs every 4-6 hours as needed for chest tightness, wheezing, or coughing - Use Albuterol  (Proair /Ventolin ) 2 puffs 15 minutes prior to exercise if you have symptoms with activity - Use a spacer with all inhalers - please keep track of how often you are needing rescue inhaler Albuterol  (Proair /Ventolin ) as this will help guide future management - Asthma is not controlled if:  - Symptoms are occurring >2 times a week  during the day  OR  - >2 times a month nighttime awakenings  - Please call the clinic to schedule a follow up if these symptoms arise   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 Topical steroids and antihistamines recommended. - Avoidance measures (DEET repellant for mosquitos, long clothing, etc) If bite occurs with raised rash:  - For itch: Topical steroid (hydrocortisone cream) twice daily as needed + oral antihistamine (zyrtec ) - For pain and swelling: Oral anti-inflammatory (ibuprofen ), ice affected area       Follow up: for allergy testing (1-55)  Hold antihistamines for 3 days prior.    Thank you so much for letting me partake in your care today.  Don't hesitate to reach out if you have any additional concerns!  Orelia Binet, MD  [facility]- [place], [place]

## 2023-11-23 ENCOUNTER — Ambulatory Visit: Admitting: Internal Medicine

## 2023-11-23 DIAGNOSIS — J309 Allergic rhinitis, unspecified: Secondary | ICD-10-CM

## 2024-06-09 ENCOUNTER — Encounter (HOSPITAL_COMMUNITY): Payer: Self-pay | Admitting: Obstetrics & Gynecology

## 2024-06-09 ENCOUNTER — Encounter: Payer: Self-pay | Admitting: Family Medicine

## 2024-06-09 ENCOUNTER — Inpatient Hospital Stay (HOSPITAL_COMMUNITY)
Admission: AD | Admit: 2024-06-09 | Discharge: 2024-06-09 | Disposition: A | Discharge status: Home / Self Care | Attending: Family Medicine | Admitting: Family Medicine

## 2024-06-09 DIAGNOSIS — R109 Unspecified abdominal pain: Secondary | ICD-10-CM | POA: Insufficient documentation

## 2024-06-09 DIAGNOSIS — O26891 Other specified pregnancy related conditions, first trimester: Secondary | ICD-10-CM | POA: Diagnosis not present

## 2024-06-09 DIAGNOSIS — R102 Pelvic and perineal pain unspecified side: Secondary | ICD-10-CM

## 2024-06-09 DIAGNOSIS — O209 Hemorrhage in early pregnancy, unspecified: Secondary | ICD-10-CM

## 2024-06-09 DIAGNOSIS — Z3A01 Less than 8 weeks gestation of pregnancy: Secondary | ICD-10-CM | POA: Diagnosis not present

## 2024-06-09 DIAGNOSIS — Z113 Encounter for screening for infections with a predominantly sexual mode of transmission: Secondary | ICD-10-CM | POA: Diagnosis present

## 2024-06-09 LAB — URINALYSIS, ROUTINE W REFLEX MICROSCOPIC
Bilirubin Urine: NEGATIVE
Glucose, UA: NEGATIVE mg/dL
Hgb urine dipstick: NEGATIVE
Ketones, ur: NEGATIVE mg/dL
Leukocytes,Ua: NEGATIVE
Nitrite: NEGATIVE
Protein, ur: NEGATIVE mg/dL
Specific Gravity, Urine: 1.008 (ref 1.005–1.030)
pH: 7 (ref 5.0–8.0)

## 2024-06-09 LAB — WET PREP, GENITAL
Sperm: NONE SEEN
Trich, Wet Prep: NONE SEEN
WBC, Wet Prep HPF POC: >=10 — AB
Yeast Wet Prep HPF POC: NONE SEEN

## 2024-06-09 LAB — POCT PREGNANCY, URINE: Preg Test, Ur: POSITIVE — AB

## 2024-06-09 MED ORDER — METRONIDAZOLE 0.75 % VA GEL: 1.0000 | Freq: Every day | VAGINAL | 0 refills | Status: AC

## 2024-06-09 NOTE — Discharge Instructions (Signed)
Your ultrasound shows a healthy pregnancy. We discussed that vaginal bleeding in the first trimester is common, and that 80-90% of patients will go on to have a normal pregnancy with a live delivery. The remainder are at increased risk for miscarriage, unfortunately there are no known interventions to mitigate this risk. We discussed return precautions including crescendo abdominal pain, heavy vaginal bleeding soaking >1 pad/hour, and fever.

## 2024-06-09 NOTE — MAU Provider Note (Addendum)
 " History     [id number]  Arrival date and time: 06/09/24 1621    Chief Complaint  Patient presents with   Vaginal Bleeding     HPI Claire Russell is a 28 y.o. at [redacted]w[redacted]d by sure LMP with PMHx notable for n/a, who presents for cramping and bleeding.   Patient reports sure and regular LMP prior to conception For past three days has had cramping, more L than R, and spotting like a period No burning or pain with urination No abnormal vaginal discharge, no itching or odor      OB History     Gravida  5   Para  0   Term  0   Preterm  0   AB  3   Living  0      SAB  0   IAB  3   Ectopic  0   Multiple  0   Live Births  0           Past Medical History:  Diagnosis Date   Recurrent upper respiratory infection (URI)     No past surgical history on file.  Family History  Problem Relation Age of Onset   Eczema Mother    Allergic rhinitis Mother    Hypertension Mother    Healthy Father     Social History   Socioeconomic History   Marital status: Single    Spouse name: Not on file   Number of children: Not on file   Years of education: Not on file   Highest education level: Not on file  Occupational History   Not on file  Tobacco Use   Smoking status: Former    Current packs/day: 0.50    Types: Cigarettes   Smokeless tobacco: Never  Vaping Use   Vaping status: Never Used  Substance and Sexual Activity   Alcohol use: Yes    Comment: occ   Drug use: No   Sexual activity: Yes    Birth control/protection: None  Other Topics Concern   Not on file  Social History Narrative   Not on file   Social Drivers of Health   Tobacco Use: Medium Risk (10/19/2023)   Patient History    Smoking Tobacco Use: Former    Smokeless Tobacco Use: Never    Passive Exposure: Not on Actuary Strain: Not on file  Food Insecurity: Not on file  Transportation Needs: Not on file  Physical Activity: Not on file  Stress: Not on file  Social  Connections: Unknown (10/31/2021)   [facility]   Social Network    Social Network: Not on file  Intimate Partner Violence: Unknown (09/22/2021)   Received from [facility]   HITS    Physically Hurt: Not on file    Insult or Talk Down To: Not on file    Threaten Physical Harm: Not on file    Scream or Curse: Not on file  Depression (PHQ2-9): Not on file  Alcohol Screen: Not on file  Housing: Not on file  Utilities: Not on file  Health Literacy: Not on file    Allergies[1]  Medications Ordered Prior to Encounter[2]   ROS Pertinent positives and negative per HPI, all others reviewed and negative  Physical Exam   BP (!) 155/96 (BP Location: Right Arm)   Pulse 60   Temp 98.7 F (37.1 C) (Oral)   Resp 12   Ht 5' 3 (1.6 m)   Wt 79 kg  LMP 04/21/2024 (Approximate)   BMI 30.84 kg/m   Patient Vitals for the past 24 hrs:  BP Temp Temp src Pulse Resp Height Weight  06/09/24 1930 (!) 155/96 98.7 F (37.1 C) Oral 60 12 5' 3 (1.6 m) 79 kg    Physical Exam Vitals reviewed.  Constitutional:      General: She is not in acute distress.    Appearance: She is well-developed. She is not diaphoretic.  Eyes:     General: No scleral icterus. Pulmonary:     Effort: Pulmonary effort is normal. No respiratory distress.  Abdominal:     General: There is no distension.     Palpations: Abdomen is soft.     Tenderness: There is no abdominal tenderness. There is no guarding or rebound.  Skin:    General: Skin is warm and dry.  Neurological:     Mental Status: She is alert.     Coordination: Coordination normal.      Cervical Exam    Bedside Ultrasound Pt informed that the ultrasound is considered a limited OB ultrasound and is not intended to be a complete ultrasound exam.  Patient also informed that the ultrasound is not being completed with the intent of assessing for fetal or placental anomalies or any pelvic abnormalities.  Explained that the purpose of  todays ultrasound is to assess for  viability.  Patient acknowledges the purpose of the exam and the limitations of the study.      My interpretation: First trimester findings: Intrauterine gestational sac seen: yes Gestational sac summary: fetal pole seen Fetal cardiac activity: 154 bpm by M mode    Labs Results for orders placed or performed during the hospital encounter of 06/09/24 (from the past 24 hours)  Urinalysis, Routine w reflex microscopic -Urine, Clean Catch     Status: Abnormal   Collection Time: 06/09/24  5:33 PM  Result Value Ref Range   Color, Urine STRAW (A) YELLOW   APPearance CLEAR CLEAR   Specific Gravity, Urine 1.008 1.005 - 1.030   pH 7.0 5.0 - 8.0   Glucose, UA NEGATIVE NEGATIVE mg/dL   Hgb urine dipstick NEGATIVE NEGATIVE   Bilirubin Urine NEGATIVE NEGATIVE   Ketones, ur NEGATIVE NEGATIVE mg/dL   Protein, ur NEGATIVE NEGATIVE mg/dL   Nitrite NEGATIVE NEGATIVE   Leukocytes,Ua NEGATIVE NEGATIVE  Wet prep, genital     Status: Abnormal   Collection Time: 06/09/24  5:33 PM   Specimen: PATH Cytology Cervicovaginal Ancillary Only  Result Value Ref Range   Yeast Wet Prep HPF POC NONE SEEN NONE SEEN   Trich, Wet Prep NONE SEEN NONE SEEN   Clue Cells Wet Prep HPF POC PRESENT (A) NONE SEEN   WBC, Wet Prep HPF POC >=10 (A) <10   Sperm NONE SEEN   Pregnancy, urine POC     Status: Abnormal   Collection Time: 06/09/24  5:37 PM  Result Value Ref Range   Preg Test, Ur POSITIVE (A) NEGATIVE    Imaging No results found.  MAU Course  Procedures Lab Orders         Wet prep, genital         Urinalysis, Routine w reflex microscopic -Urine, Clean Catch         Pregnancy, urine POC    No orders of the defined types were placed in this encounter.  Imaging Orders  No imaging studies ordered today    MDM Moderate (Level 3-4)  Assessment and Plan  #Abdominal pain in  pregnancy, first trimester, Vaginal bleeding in pregnancy, first trimester #[redacted] weeks gestation of  pregnancy US  shows viable IUP with normal FHR. We discussed that vaginal bleeding in the first trimester is common, and that 80-90% of patients will go on to have a normal pregnancy with a live delivery. The remainder are at increased risk for miscarriage, unfortunately there are no known interventions to mitigate this risk.  , rhogam not indicated. We discussed return precautions including crescendo abdominal pain, heavy vaginal bleeding soaking >1 pad/hour, and fever.  #BV Metrogel  sent to pharmacy   Dispo: discharged to home in stable condition    Donnice CHRISTELLA Carolus, MD/MPH 06/09/2024 8:11 PM  Allergies as of 06/09/2024       Reactions   Mosquito (diagnostic) Rash        Medication List     STOP taking these medications    benzonatate  100 MG capsule Commonly known as: TESSALON    cetirizine  10 MG tablet Commonly known as: ZYRTEC    fluconazole 150 MG tablet Commonly known as: DIFLUCAN   fluticasone  50 MCG/ACT nasal spray Commonly known as: FLONASE    NIFEdipine 10 MG capsule Commonly known as: PROCARDIA   norethindrone 0.35 MG tablet Commonly known as: MICRONOR   triamcinolone  ointment 0.1 % Commonly known as: KENALOG        TAKE these medications    acetaminophen  325 MG tablet Commonly known as: Tylenol  Take 2 tablets (650 mg total) by mouth every 6 (six) hours as needed for moderate pain.   Airsupra  90-80 MCG/ACT Aero Generic drug: Albuterol -Budesonide Inhale 2 puffs into the lungs every 4 (four) hours as needed (cough, wheeze, dyspnea).   albuterol  108 (90 Base) MCG/ACT inhaler Commonly known as: VENTOLIN  HFA Inhale 2 puffs into the lungs every 4 (four) hours as needed.   amLODipine  5 MG tablet Commonly known as: NORVASC  Take 1 tablet (5 mg total) by mouth daily.   ipratropium 0.03 % nasal spray Commonly known as: ATROVENT  Place 2 sprays into both nostrils 2 (two) times daily.   metroNIDAZOLE  0.75 % vaginal gel Commonly known as:  METROGEL  Place 1 Applicatorful vaginally at bedtime for 5 days.   ondansetron  8 MG disintegrating tablet Commonly known as: ZOFRAN -ODT Take 1 tablet (8 mg total) by mouth every 8 (eight) hours as needed for nausea or vomiting.   promethazine -dextromethorphan 6.25-15 MG/5ML syrup Commonly known as: PROMETHAZINE -DM Take 5 mLs by mouth 4 (four) times daily as needed for cough.           [1]  Allergies Allergen Reactions   Mosquito (Diagnostic) Rash  [2]  No current facility-administered medications on file prior to encounter.   Current Outpatient Medications on File Prior to Encounter  Medication Sig Dispense Refill   NIFEdipine (PROCARDIA) 10 MG capsule Take 10 mg by mouth 3 (three) times daily.     acetaminophen  (TYLENOL ) 325 MG tablet Take 2 tablets (650 mg total) by mouth every 6 (six) hours as needed for moderate pain. 30 tablet 0   albuterol  (VENTOLIN  HFA) 108 (90 Base) MCG/ACT inhaler Inhale 2 puffs into the lungs every 4 (four) hours as needed.     Albuterol -Budesonide (AIRSUPRA ) 90-80 MCG/ACT AERO Inhale 2 puffs into the lungs every 4 (four) hours as needed (cough, wheeze, dyspnea). 10.7 g 5   amLODipine  (NORVASC ) 5 MG tablet Take 1 tablet (5 mg total) by mouth daily. 30 tablet 0   benzonatate  (TESSALON ) 100 MG capsule Take 1 capsule (100 mg total) by mouth 3 (three) times daily as needed  for cough. 30 capsule 0   cetirizine  (ZYRTEC ) 10 MG tablet Take 1 tablet (10 mg total) by mouth daily. 30 tablet 5   fluconazole (DIFLUCAN) 150 MG tablet Take 150 mg by mouth every 3 (three) days.     fluticasone  (FLONASE ) 50 MCG/ACT nasal spray Place 1 spray into both nostrils 2 (two) times daily. 16 g 2   ipratropium (ATROVENT ) 0.03 % nasal spray Place 2 sprays into both nostrils 2 (two) times daily. 30 mL 0   norethindrone (MICRONOR) 0.35 MG tablet Take 1 tablet by mouth daily.     ondansetron  (ZOFRAN -ODT) 8 MG disintegrating tablet Take 1 tablet (8 mg total) by mouth every 8 (eight)  hours as needed for nausea or vomiting. 20 tablet 0   promethazine -dextromethorphan (PROMETHAZINE -DM) 6.25-15 MG/5ML syrup Take 5 mLs by mouth 4 (four) times daily as needed for cough. 200 mL 0   triamcinolone  ointment (KENALOG ) 0.1 % Apply topically twice daily to BODY as needed for red, sandpaper like rash.  Do not use on face, groin or armpits. 80 g 1   "

## 2024-06-09 NOTE — MAU Note (Signed)
 Pt says she has light pink  spotting - started on 05-28-2024.   Has spotting now when she wipes . Cramping started -last Friday 6/10. Pain 0/10

## 2024-06-10 LAB — GC/CHLAMYDIA PROBE AMP (~~LOC~~) NOT AT ARMC
Chlamydia: NEGATIVE
Comment: NEGATIVE
Comment: NORMAL
Neisseria Gonorrhea: NEGATIVE

## 2024-06-23 ENCOUNTER — Encounter (HOSPITAL_BASED_OUTPATIENT_CLINIC_OR_DEPARTMENT_OTHER): Payer: Self-pay

## 2024-06-23 ENCOUNTER — Telehealth (HOSPITAL_BASED_OUTPATIENT_CLINIC_OR_DEPARTMENT_OTHER): Payer: Self-pay

## 2024-06-23 NOTE — Telephone Encounter (Signed)
 Not Available - Was not able to leave vm. Sent pt a message about scheduling a new OB appointment.  Records were scanned into chart.

## 2024-06-27 ENCOUNTER — Ambulatory Visit (INDEPENDENT_AMBULATORY_CARE_PROVIDER_SITE_OTHER)

## 2024-06-27 ENCOUNTER — Encounter (HOSPITAL_BASED_OUTPATIENT_CLINIC_OR_DEPARTMENT_OTHER): Payer: Self-pay

## 2024-06-27 VITALS — BP 129/95 | HR 87 | Ht 63.0 in | Wt 168.0 lb

## 2024-06-27 DIAGNOSIS — Z3480 Encounter for supervision of other normal pregnancy, unspecified trimester: Secondary | ICD-10-CM | POA: Insufficient documentation

## 2024-06-27 DIAGNOSIS — Z3201 Encounter for pregnancy test, result positive: Secondary | ICD-10-CM

## 2024-06-27 DIAGNOSIS — N912 Amenorrhea, unspecified: Secondary | ICD-10-CM

## 2024-06-27 DIAGNOSIS — Z3A09 9 weeks gestation of pregnancy: Secondary | ICD-10-CM

## 2024-06-27 LAB — POCT URINE PREGNANCY: Preg Test, Ur: POSITIVE — AB

## 2024-06-27 MED ORDER — PROMETHAZINE HCL 25 MG PO TABS: 25.0000 mg | ORAL_TABLET | Freq: Four times a day (QID) | ORAL | 0 refills | Status: AC | PRN

## 2024-06-27 MED ORDER — PRENATAL 27-1 MG PO TABS: 1.0000 | ORAL_TABLET | Freq: Every day | ORAL | 11 refills | Status: AC

## 2024-06-27 NOTE — Progress Notes (Signed)
 NURSE VISIT- PREGNANCY CONFIRMATION   SUBJECTIVE:  Claire Russell is a 29 y.o. [id number] female at [redacted]w[redacted]d by uncertain LMP. Patient's last menstrual period was 04/21/2024 (approximate). Here for pregnancy confirmation.  Home pregnancy test: positive x 3  She reports nausea.  She is not taking prenatal vitamins.    I explained I am completing New OB Intake today. We discussed EDD of 01/26/2025 based on LMP of 04/21/2024. I reviewed her allergies, medications and Medical/Surgical/OB history.    There are no active problems to display for this patient.    Concerns addressed today   MyChart/Babyscripts MyChart access verified. I explained pt will have some visits in office and some virtually. Babyscripts instructions given and order placed. Patient verifies receipt of registration text/e-mail. Account successfully created and app downloaded.    Blood Pressure Cuff/Weight Scale Patient has a blood pressure cuff at home. Explained after first prenatal appt pt will check weekly and document in Babyscripts. Patient does have weight scale.  Is patient a candidate for Babyscripts Optimization? Yes, patient accepted   OBJECTIVE:  BP (!) 129/95 (BP Location: Right Arm, Patient Position: Sitting, Cuff Size: Normal)   Pulse 87   Ht 5' 3 (1.6 m)   Wt 168 lb (76.2 kg)   LMP 04/21/2024 (Approximate)   SpO2 100%   BMI 29.76 kg/m   Appears well, in no apparent distress  Results for orders placed or performed in visit on 06/27/24 (from the past 24 hours)  POCT urine pregnancy   Collection Time: 06/27/24 11:20 AM  Result Value Ref Range   Preg Test, Ur Positive (A) Negative    ASSESSMENT: Positive pregnancy test, 106w4d by LMP    PLAN: Prenatal vitamins: Prenatal 27-1 mg take 1 tablet daily #30 11RF sent to pharmacy on file. Nausea medicines: Phenergan  25 mg take 1 tablet q6h PRN for nausea sent to pharmacy on file.   New OB packet provided and reviewed with patient. Advised to return completed  paperwork at new OB appointment. New OB appointment scheduled for 07/01/2024 with Nidia Daring, NP. Viability scan performed at the hospital on 06/09/2024.Which showed First trimester findings: Intrauterine gestational sac seen: yes Gestational sac summary: fetal pole seen Fetal cardiac activity: 154 bpm by CHRISTELLA juli Sales, Juquan Reznick E, RN 06/27/2024  12:35 PM

## 2024-07-01 ENCOUNTER — Encounter (HOSPITAL_BASED_OUTPATIENT_CLINIC_OR_DEPARTMENT_OTHER): Payer: Self-pay

## 2024-07-01 ENCOUNTER — Ambulatory Visit (INDEPENDENT_AMBULATORY_CARE_PROVIDER_SITE_OTHER): Admitting: Obstetrics and Gynecology

## 2024-07-01 ENCOUNTER — Other Ambulatory Visit (HOSPITAL_COMMUNITY)
Admission: RE | Admit: 2024-07-01 | Discharge: 2024-07-01 | Disposition: A | Discharge status: Home / Self Care | Source: Ambulatory Visit | Attending: Obstetrics and Gynecology | Admitting: Obstetrics and Gynecology

## 2024-07-01 VITALS — BP 133/89 | HR 87 | Wt 170.0 lb

## 2024-07-01 DIAGNOSIS — Z3A10 10 weeks gestation of pregnancy: Secondary | ICD-10-CM | POA: Insufficient documentation

## 2024-07-01 DIAGNOSIS — Z1332 Encounter for screening for maternal depression: Secondary | ICD-10-CM

## 2024-07-01 DIAGNOSIS — Z3480 Encounter for supervision of other normal pregnancy, unspecified trimester: Secondary | ICD-10-CM | POA: Diagnosis present

## 2024-07-01 DIAGNOSIS — O161 Unspecified maternal hypertension, first trimester: Secondary | ICD-10-CM

## 2024-07-01 DIAGNOSIS — I10 Essential (primary) hypertension: Secondary | ICD-10-CM | POA: Insufficient documentation

## 2024-07-01 DIAGNOSIS — Z3A00 Weeks of gestation of pregnancy not specified: Secondary | ICD-10-CM

## 2024-07-01 MED ORDER — ASPIRIN 81 MG PO TBEC: 81.0000 mg | DELAYED_RELEASE_TABLET | Freq: Every day | ORAL | 2 refills | Status: AC

## 2024-07-01 NOTE — Progress Notes (Signed)
 "  INITIAL PRENATAL VISIT  Subjective:   Claire Russell is being seen today for her first obstetrical visit.  She is at [redacted]w[redacted]d gestation by LMP Her obstetrical history is significant for HTN. Relationship with FOB: significant other, living together. Patient does intend to breast feed. Pregnancy history fully reviewed.  Patient reports no complaints.  Delivery Plans Plans to deliver at [facility]. Discussed the nature of our practice with multiple providers including residents and students as well as female and female providers. Due to the size of the practice, the delivering provider may not be the same as those providing prenatal care.    Patient is not interested in water birth.   Anatomy US  Explained first scheduled US  will be around 19 weeks. Anatomy US  ordered.   Is patient a Centering Pregnancy candidate?  Yes, declined    Is patient a Mom+Baby Combined Care candidate?  Yes, declined    Is patient a candidate for Babyscripts Optimization? No  Pap smear history:  Objective:    Obstetric History OB History  Gravida Para Term Preterm AB Living  5 0 0 0 3 0  SAB IAB Ectopic Multiple Live Births  0 3 0 0 0    # Outcome Date GA Lbr Len/2nd Weight Sex Type Anes PTL Lv  5 Current           4 IAB           3 IAB           2 IAB           1 Gravida             Past Medical History:  Diagnosis Date   Recurrent upper respiratory infection (URI)     No past surgical history on file.  Medications Ordered Prior to Encounter[1]  Allergies[2]  Social History:  reports that she has quit smoking. Her smoking use included cigarettes. She has never used smokeless tobacco. She reports that she does not currently use alcohol. She reports that she does not use drugs.  Family History  Problem Relation Age of Onset   Eczema Mother    Allergic rhinitis Mother    Hypertension Mother    Healthy Father     The following portions of the patient's history were reviewed and  updated as appropriate: allergies, current medications, past family history, past medical history, past social history, past surgical history and problem list.  Review of Systems Review of Systems  All other systems reviewed and are negative.   Physical Exam:  BP 133/89   Pulse 87   Wt 170 lb (77.1 kg)   LMP 04/21/2024 (Approximate)   BMI 30.11 kg/m  CONSTITUTIONAL: Well-developed, well-nourished female in no acute distress.  HENT:  Normocephalic, atraumatic.   SKIN: Skin is warm and dry. MUSCULOSKELETAL: Normal range of motion NEUROLOGIC: Alert and oriented  PSYCHIATRIC: Normal mood and affect. Normal behavior.  RESPIRATORY: normal effort ABDOMEN: Soft PELVIC:deferred  Fetal Heart Rate (bpm): 164   Movement: Absent       Assessment:    Pregnancy: G5P0030  1. Supervision of other normal pregnancy, antepartum (Primary) 2. [redacted] weeks gestation of pregnancy BP and FHR normal Declined flu shot [facility]  - ABO/Rh - Antibody screen - CBC - Hepatitis B surface antigen - HIV Antibody (routine testing w rflx) - HIV (Save tube for possible reflex) - RPR - Rubella screen - Hepatitis C antibody -  HgB A1c - Cervicovaginal ancillary only( [place]) - Culture, OB Urine - PANORAMA PRENATAL TEST - HORIZON Basic Panel  3. Hypertension affecting pregnancy in first trimester Seen by PCP recently switched to nifedipine Discussed recommendation for ASA, rx sent to start at 12 weeks Babyscripts app, continue checking BP, follow up precautions given - Protein / creatinine ratio, urine - aspirin  EC 81 MG tablet; Take 1 tablet (81 mg total) by mouth daily. Start taking when you are [redacted] weeks pregnant for rest of pregnancy for prevention of preeclampsia  Dispense: 300 tablet; Refill: 2 - Comp Met (CMET)      Plan:     Initial labs drawn. Prenatal vitamins. Problem list reviewed and updated. Reviewed in detail the  nature of the practice with collaborative care between  Genetic screening discussed: NIPS/First trimester screen/Quad/AFP ordered. Role of ultrasound in pregnancy discussed; Anatomy US : ordered. Follow up in 4 weeks. Discussed clinic routines, schedule of care and testing, genetic screening options, involvement of students and residents under the direct supervision of APPs and doctors and presence of female providers. Pt verbalized understanding.  Return in 4 weeks for OB visit   Delores Nidia CROME, FNP       [1]  Current Outpatient Medications on File Prior to Visit  Medication Sig Dispense Refill   NIFEdipine (ADALAT CC) 30 MG 24 hr tablet Take 30 mg by mouth daily.     ondansetron  (ZOFRAN -ODT) 8 MG disintegrating tablet Take 1 tablet (8 mg total) by mouth every 8 (eight) hours as needed for nausea or vomiting. 20 tablet 0   promethazine  (PHENERGAN ) 25 MG tablet Take 1 tablet (25 mg total) by mouth every 6 (six) hours as needed for nausea or vomiting. 30 tablet 0   acetaminophen  (TYLENOL ) 325 MG tablet Take 2 tablets (650 mg total) by mouth every 6 (six) hours as needed for moderate pain. (Patient not taking: Reported on 07/01/2024) 30 tablet 0   albuterol  (VENTOLIN  HFA) 108 (90 Base) MCG/ACT inhaler Inhale 2 puffs into the lungs every 4 (four) hours as needed. (Patient not taking: Reported on 07/01/2024)     Albuterol -Budesonide (AIRSUPRA ) 90-80 MCG/ACT AERO Inhale 2 puffs into the lungs every 4 (four) hours as needed (cough, wheeze, dyspnea). (Patient not taking: Reported on 07/01/2024) 10.7 g 5   amLODipine  (NORVASC ) 5 MG tablet Take 1 tablet (5 mg total) by mouth daily. (Patient not taking: Reported on 07/01/2024) 30 tablet 0   ipratropium (ATROVENT ) 0.03 % nasal spray Place 2 sprays into both nostrils 2 (two) times daily. (Patient not taking: Reported on 07/01/2024) 30 mL 0   Prenatal 27-1 MG TABS Take 1 tablet by mouth daily. (Patient not taking: Reported on 07/01/2024) 30 tablet 11    promethazine -dextromethorphan (PROMETHAZINE -DM) 6.25-15 MG/5ML syrup Take 5 mLs by mouth 4 (four) times daily as needed for cough. 200 mL 0   No current facility-administered medications on file prior to visit.  [2]  Allergies Allergen Reactions   Mosquito (Diagnostic) Rash   "

## 2024-07-02 ENCOUNTER — Ambulatory Visit: Payer: Self-pay | Admitting: Obstetrics and Gynecology

## 2024-07-02 ENCOUNTER — Encounter: Payer: Self-pay | Admitting: Obstetrics and Gynecology

## 2024-07-02 DIAGNOSIS — Z3480 Encounter for supervision of other normal pregnancy, unspecified trimester: Secondary | ICD-10-CM

## 2024-07-02 DIAGNOSIS — Z148 Genetic carrier of other disease: Secondary | ICD-10-CM

## 2024-07-02 LAB — CBC
Hematocrit: 39.9 % (ref 34.0–46.6)
Hemoglobin: 13.2 g/dL (ref 11.1–15.9)
MCH: 31.2 pg (ref 26.6–33.0)
MCHC: 33.1 g/dL (ref 31.5–35.7)
MCV: 94 fL (ref 79–97)
Platelets: 286 x10E3/uL (ref 150–450)
RBC: 4.23 x10E6/uL (ref 3.77–5.28)
RDW: 12.3 % (ref 11.7–15.4)
WBC: 6.4 x10E3/uL (ref 3.4–10.8)

## 2024-07-02 LAB — SYPHILIS: RPR W/REFLEX TO RPR TITER AND TREPONEMAL ANTIBODIES, TRADITIONAL SCREENING AND DIAGNOSIS ALGORITHM: RPR Ser Ql: NONREACTIVE

## 2024-07-02 LAB — RUBELLA SCREEN: Rubella Antibodies, IGG: 2.52 {index}

## 2024-07-02 LAB — CERVICOVAGINAL ANCILLARY ONLY
Chlamydia: NEGATIVE
Comment: NEGATIVE
Comment: NEGATIVE
Comment: NORMAL
Neisseria Gonorrhea: NEGATIVE
Trichomonas: NEGATIVE

## 2024-07-02 LAB — HEPATITIS C ANTIBODY: Hep C Virus Ab: NONREACTIVE

## 2024-07-02 LAB — HEMOGLOBIN A1C
Est. average glucose Bld gHb Est-mCnc: 105 mg/dL
Hgb A1c MFr Bld: 5.3 % (ref 4.8–5.6)

## 2024-07-02 LAB — ABO/RH: Rh Factor: POSITIVE

## 2024-07-02 LAB — ANTIBODY SCREEN: Antibody Screen: NEGATIVE

## 2024-07-02 LAB — HEPATITIS B SURFACE ANTIGEN: Hepatitis B Surface Ag: NEGATIVE

## 2024-07-02 LAB — HIV ANTIBODY (ROUTINE TESTING W REFLEX): HIV Screen 4th Generation wRfx: NONREACTIVE

## 2024-07-03 LAB — PROTEIN / CREATININE RATIO, URINE
Creatinine, Urine: 14.3 mg/dL
Protein, Ur: <4 mg/dL

## 2024-07-04 LAB — CULTURE, OB URINE

## 2024-07-04 LAB — URINE CULTURE, OB REFLEX

## 2024-07-08 LAB — PANORAMA PRENATAL TEST FULL PANEL:PANORAMA TEST PLUS 5 ADDITIONAL MICRODELETIONS: FETAL FRACTION: 5.2

## 2024-07-17 LAB — HORIZON CUSTOM: REPORT SUMMARY: POSITIVE — AB

## 2024-07-18 DIAGNOSIS — Z148 Genetic carrier of other disease: Secondary | ICD-10-CM | POA: Insufficient documentation

## 2024-08-06 ENCOUNTER — Encounter (HOSPITAL_BASED_OUTPATIENT_CLINIC_OR_DEPARTMENT_OTHER): Payer: Self-pay | Admitting: Obstetrics & Gynecology
